# Patient Record
Sex: Male | Born: 1945 | Race: Black or African American | Hispanic: No | Marital: Single | Smoking: Never smoker
Health system: Southern US, Community
[De-identification: ages and names within clinical notes are randomized; demographics above are authoritative.]

## PROBLEM LIST (undated history)

## (undated) DIAGNOSIS — I10 Essential (primary) hypertension: Secondary | ICD-10-CM

## (undated) DIAGNOSIS — I639 Cerebral infarction, unspecified: Secondary | ICD-10-CM

## (undated) HISTORY — PX: EYE SURGERY: SHX253

---

## 2012-11-20 ENCOUNTER — Ambulatory Visit (INDEPENDENT_AMBULATORY_CARE_PROVIDER_SITE_OTHER): Payer: Medicare Other | Admitting: Family Medicine

## 2012-11-20 ENCOUNTER — Ambulatory Visit: Payer: Medicare Other

## 2012-11-20 VITALS — BP 158/100 | HR 100 | Temp 98.0°F | Resp 16 | Ht 67.0 in | Wt 217.0 lb

## 2012-11-20 DIAGNOSIS — M7050 Other bursitis of knee, unspecified knee: Secondary | ICD-10-CM

## 2012-11-20 DIAGNOSIS — M25562 Pain in left knee: Secondary | ICD-10-CM

## 2012-11-20 DIAGNOSIS — M25569 Pain in unspecified knee: Secondary | ICD-10-CM

## 2012-11-20 MED ORDER — OXAPROZIN 600 MG PO TABS: ORAL_TABLET | ORAL | Status: DC

## 2012-11-20 NOTE — Progress Notes (Signed)
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 He thinks he may have misstepped or something that causes pain to start, but for over a week he has had pain in the medial aspect of the left knee. It hurts intensely when he moves it just wrong way. He has never had a pain quite like this before.  Objective: Very tender on the medial aspect of the right knee in the area of the pelvis anserine bursa. He has no effusion in the joint. Range of motion of the joint is okay. He only has one area of focal tenderness.  Assessment: Knee pain Past anserine bursitis  Plan: Get an x-ray of his knee to make sure we don't see anything else, then decide treatment  [facility] reading (PRIMARY) by  Dr. Alwyn Ren Normal knee  Assessment: Pe anserine bursa  Plan: Patient feels like it has done much better since the x-rays, and that when she moved him around x-ray something pop and felt much better.  Offered him the options of oral medication or injections. He is brought to get steroid injections. I'll place him on Daypro, and we will see how he does. If he is not improving he can return for a cortisone shot.

## 2012-11-20 NOTE — Patient Instructions (Signed)
Pes Anserinus Syndrome with Rehab The pes anserine, also known as the goose's foot, is an area of the shinbone (tibia) near the knee joint where the tendons of three of the muscles of the thigh insert into the bone. These muscles are important for bending the knee and bringing the leg across the body. Just underneath the three tendons that attach at the pes anserinus exists a fluid filled sac (bursa) that is meant to reduce the friction between the tendons and the tibia. Pes anserinus syndrome is a condition that is characterized by inflammation of the bursa (bursitis) and/ or tendonitis (inflammation of the tendon) and may cause severe pain in the lower portion of the inner (medial) side of the knee. SYMPTOMS   Pain and inflammation over the lower portion of the medial side of the knee.  Pain that worsens as the duration of an activity increases.  Pain that worsens when bending the knee, especially against resistance.  A crackling sound (crepitation) when the tendon or bursa is moved or touched. CAUSES  Bursitis and tendonitis are usually characterized as overuse injuries. Common mechanisms of injury include:  Stress placed on the knee from a sudden increase in the intensity, frequency, or duration of training.  Direct trauma to the upper leg (less common). RISK INCREASES WITH:  Endurance sports (distance running or triathletes).  Making changes to or beginning a new training program.  Sports that place stress on the muscles that insert at the pes anserinus, such as those that require pivoting, cutting, or jumping.  Improper training.  Poor strength and flexibility  Failure to warm-up properly before activity.  Improper knee alignment ( knock knees).  Arthritis of the knee. PREVENTION  Warm up and stretch properly before activity.  Allow for adequate recovery between workouts.  Maintain physical fitness:  Strength, flexibility, and endurance.  Cardiovascular  fitness.  Learn and use training methods that will reduce the stress placed on the pes anserinus.  Arch supports (orthotics) may be helpful for those with flat feet. PROGNOSIS  If treated properly, then the symptoms of pes anserinus syndrome usually resolve within 6 weeks.  RELATED COMPLICATIONS   Persistent and potentially chronic pain if the condition is not treated properly.  Re-injury if activity is resumed before the injury is allowed to heal completely, or if one resumes improper training habits. TREATMENT Treatment initially involves the use of ice and medication to help reduce pain and inflammation. The use of strengthening and stretching exercises may help reduce pain with activity. These exercises may be performed at home or with a therapist. Individuals who have flat feet may find benefit in wearing arch supports in their shoes. Some individuals find that compression bandages or knee sleeves help reduce symptoms. Your caregiver may recommend a corticosteroid injection to help reduce inflammation. If symptoms persist, despite conservative treatment for greater than 6 months, then surgery may be recommended.  MEDICATION   If pain medication is necessary, then nonsteroidal anti-inflammatory medications, such as aspirin and ibuprofen, or other minor pain relievers, such as acetaminophen, are often recommended.  Do not take pain medication for 7 days before surgery.  Prescription pain relievers may be given if deemed necessary by your caregiver. Use only as directed and only as much as you need.  Corticosteroid injections may be given by your caregiver. These injections should be reserved for the most serious cases, because they may only be given a certain number of times. SEEK MEDICAL CARE IF:  Treatment seems to offer no  benefit, or the condition worsens.  Any medications produce adverse side effects.     Daypro twice daily for pain and inflammation Return if worse or no  better and we will give a cortisone injection

## 2013-05-14 IMAGING — CR DG KNEE COMPLETE 4+V*L*
4 series · 4 of 4 positions shown · non-contrast
Comparison: None.

CLINICAL DATA: Knee pain

LEFT KNEE - COMPLETE 4+ VIEW

[AP]
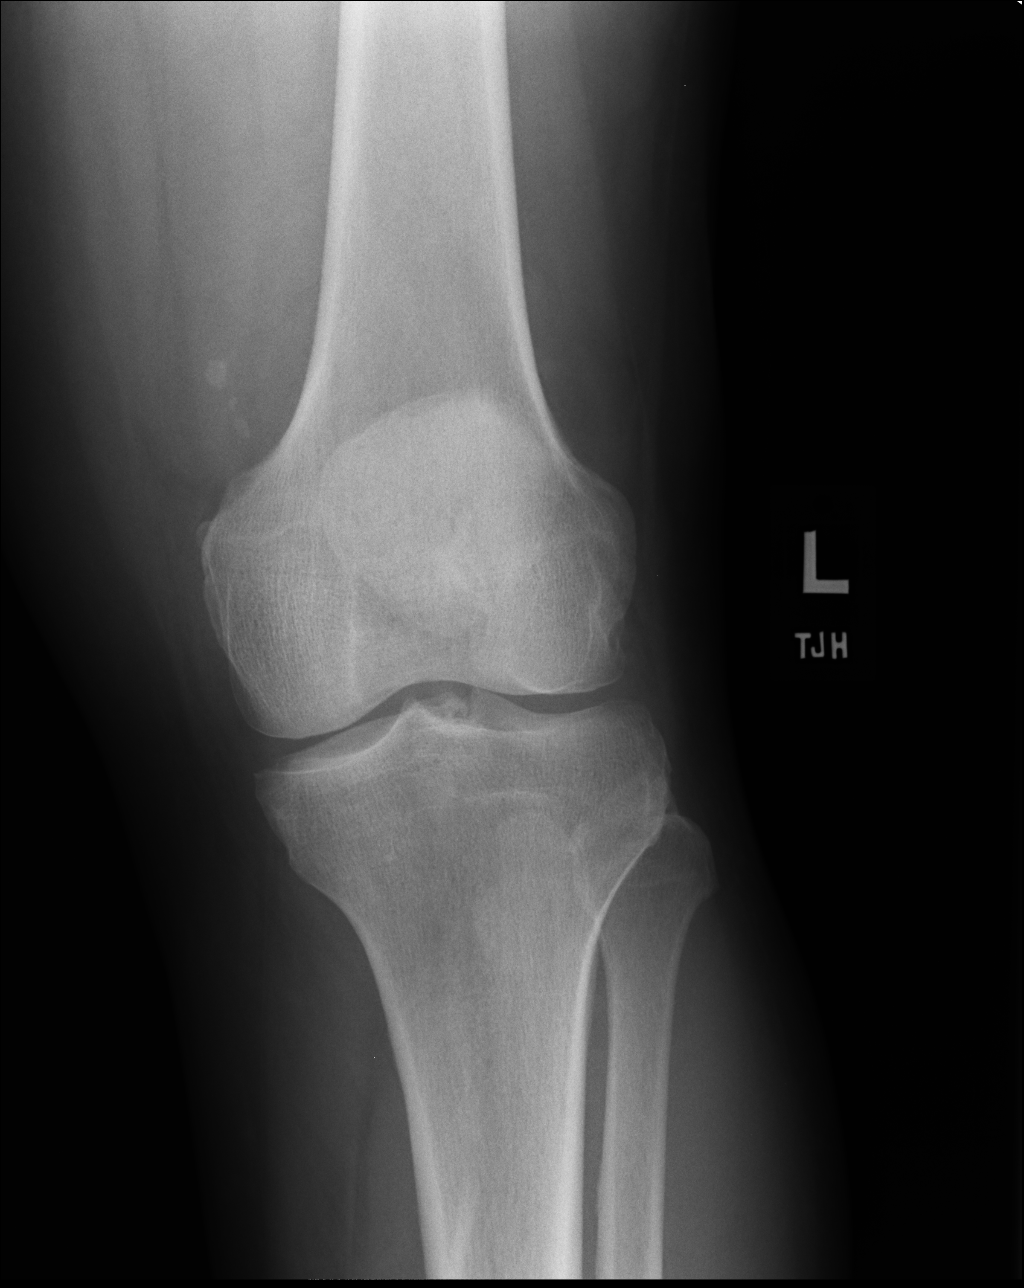

[lateral]
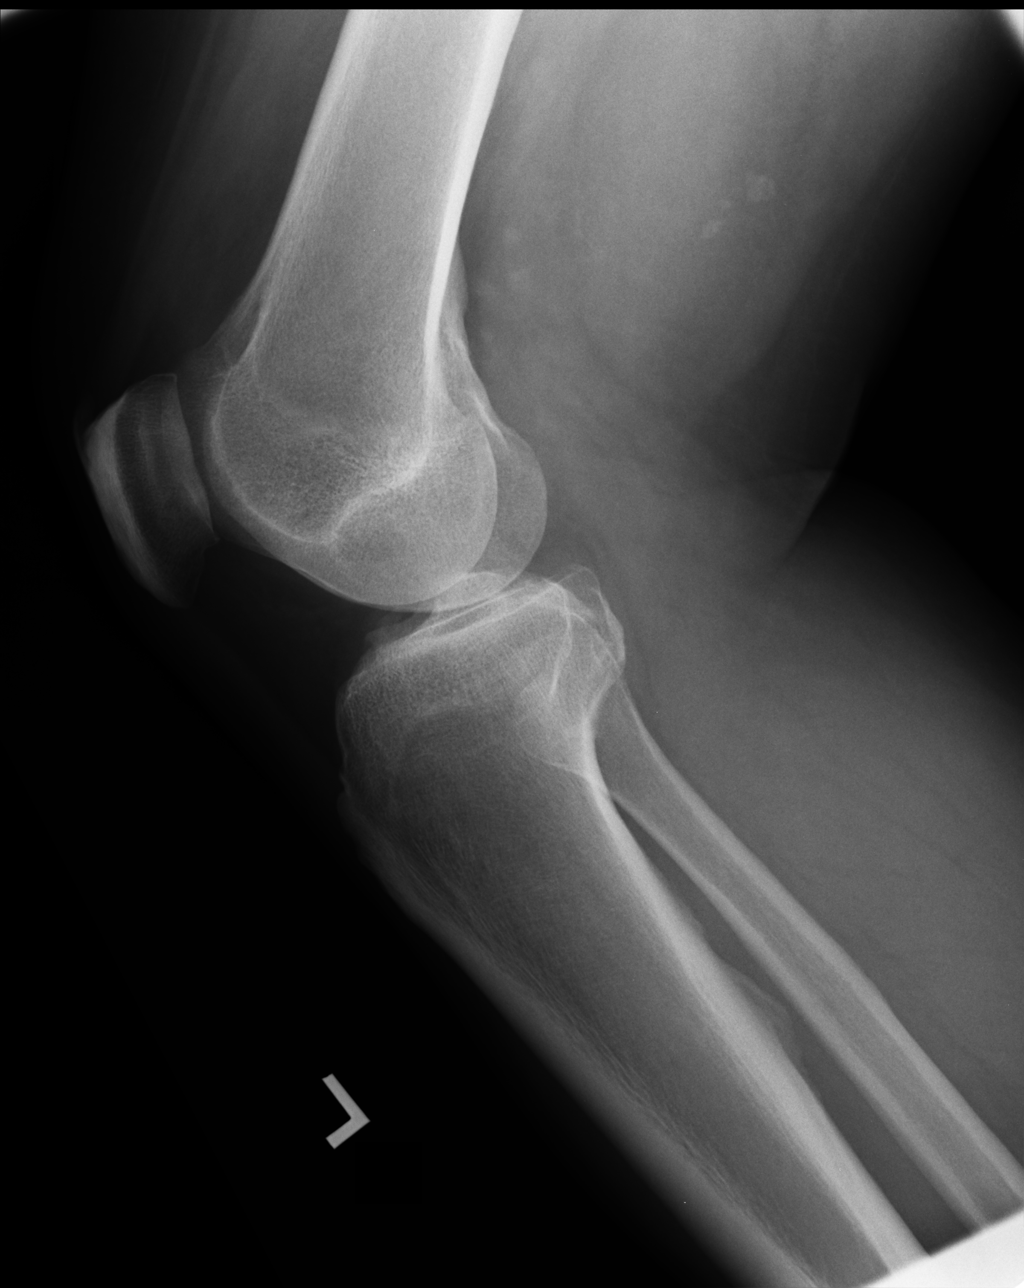

[ap ext rot]
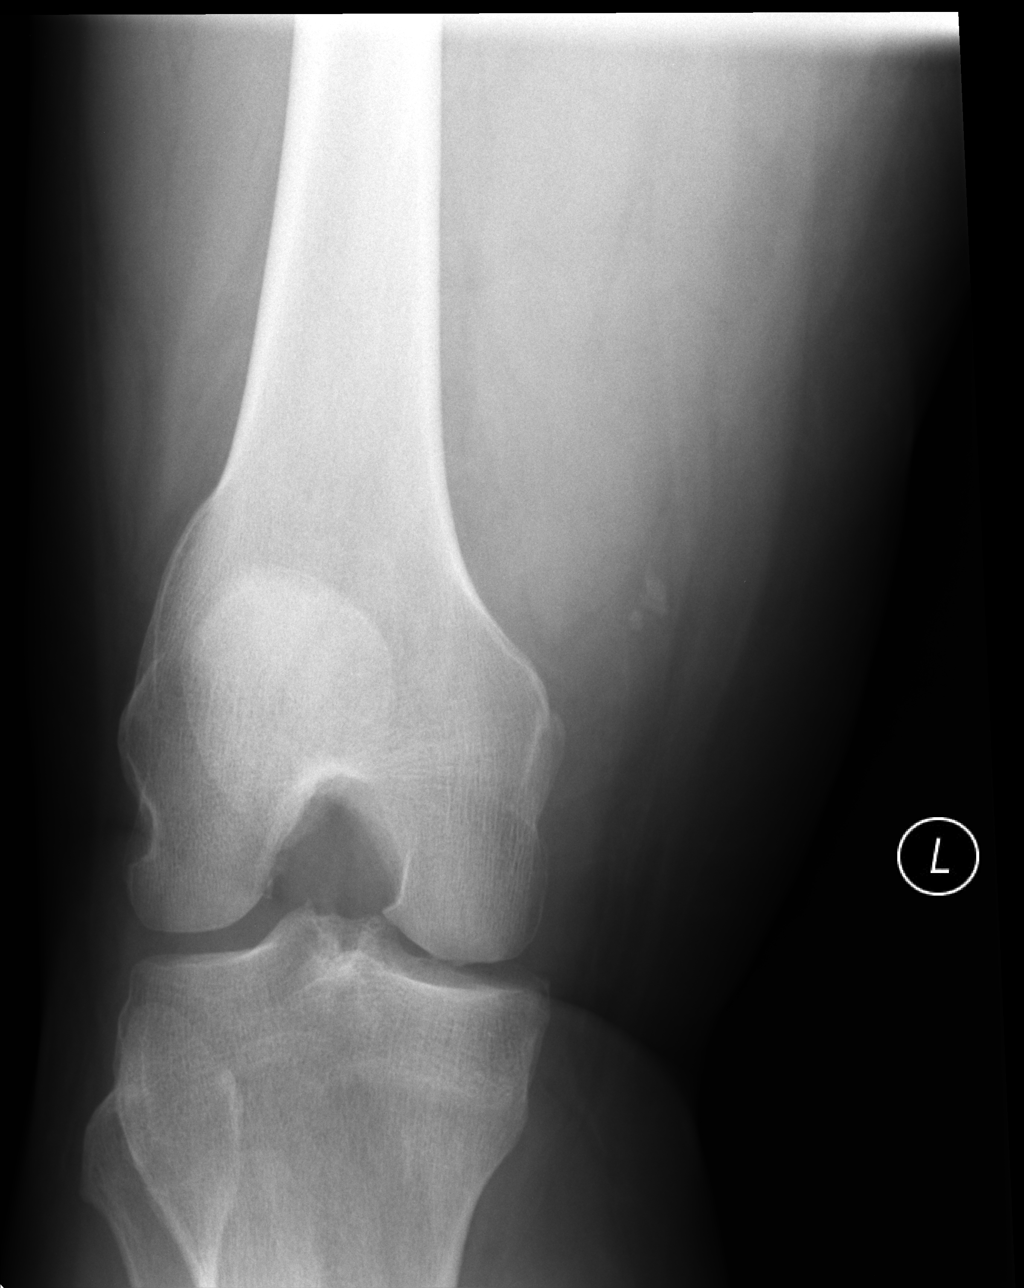

[ap int rot]
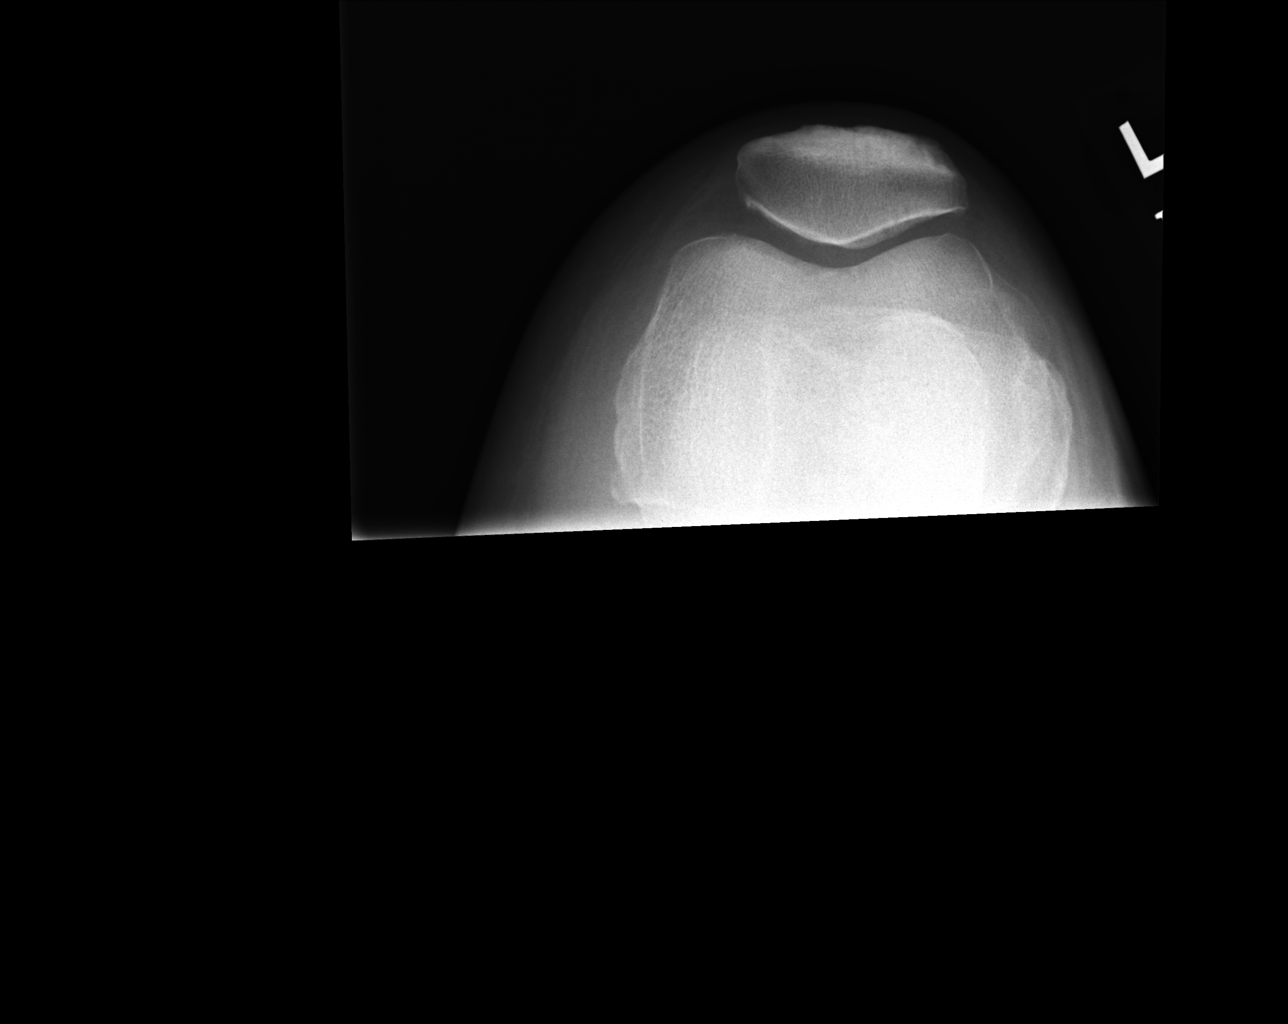

[4 of 4 positions shown; findings below may reference images not displayed]

FINDINGS: Four views of the left knee submitted.  No acute fracture
or subluxation.  No radiopaque foreign body.
IMPRESSION: No acute fracture or subluxation.

## 2019-05-22 ENCOUNTER — Emergency Department (HOSPITAL_COMMUNITY): Payer: Medicare Other

## 2019-05-22 ENCOUNTER — Encounter (HOSPITAL_COMMUNITY): Payer: Self-pay | Admitting: Emergency Medicine

## 2019-05-22 ENCOUNTER — Emergency Department (HOSPITAL_COMMUNITY)
Admission: EM | Admit: 2019-05-22 | Discharge: 2019-05-23 | Disposition: A | Discharge status: Home / Self Care | Payer: Medicare Other | Attending: Emergency Medicine | Admitting: Emergency Medicine

## 2019-05-22 DIAGNOSIS — Y999 Unspecified external cause status: Secondary | ICD-10-CM | POA: Diagnosis not present

## 2019-05-22 DIAGNOSIS — M549 Dorsalgia, unspecified: Secondary | ICD-10-CM | POA: Diagnosis not present

## 2019-05-22 DIAGNOSIS — Y939 Activity, unspecified: Secondary | ICD-10-CM | POA: Insufficient documentation

## 2019-05-22 DIAGNOSIS — Z79899 Other long term (current) drug therapy: Secondary | ICD-10-CM | POA: Diagnosis not present

## 2019-05-22 DIAGNOSIS — R030 Elevated blood-pressure reading, without diagnosis of hypertension: Secondary | ICD-10-CM | POA: Diagnosis not present

## 2019-05-22 DIAGNOSIS — S7001XA Contusion of right hip, initial encounter: Secondary | ICD-10-CM | POA: Diagnosis not present

## 2019-05-22 DIAGNOSIS — Y929 Unspecified place or not applicable: Secondary | ICD-10-CM | POA: Insufficient documentation

## 2019-05-22 DIAGNOSIS — S79911A Unspecified injury of right hip, initial encounter: Secondary | ICD-10-CM | POA: Diagnosis present

## 2019-05-22 IMAGING — DX DG HIP (WITH OR WITHOUT PELVIS) 2-3V*R*
3 series · 3 of 3 positions shown · non-contrast
Comparison: None.

CLINICAL DATA: Pain.  Right-sided hip pain.

EXAM:
DG HIP (WITH OR WITHOUT PELVIS) 2-3V RIGHT

[t pelvis ap]
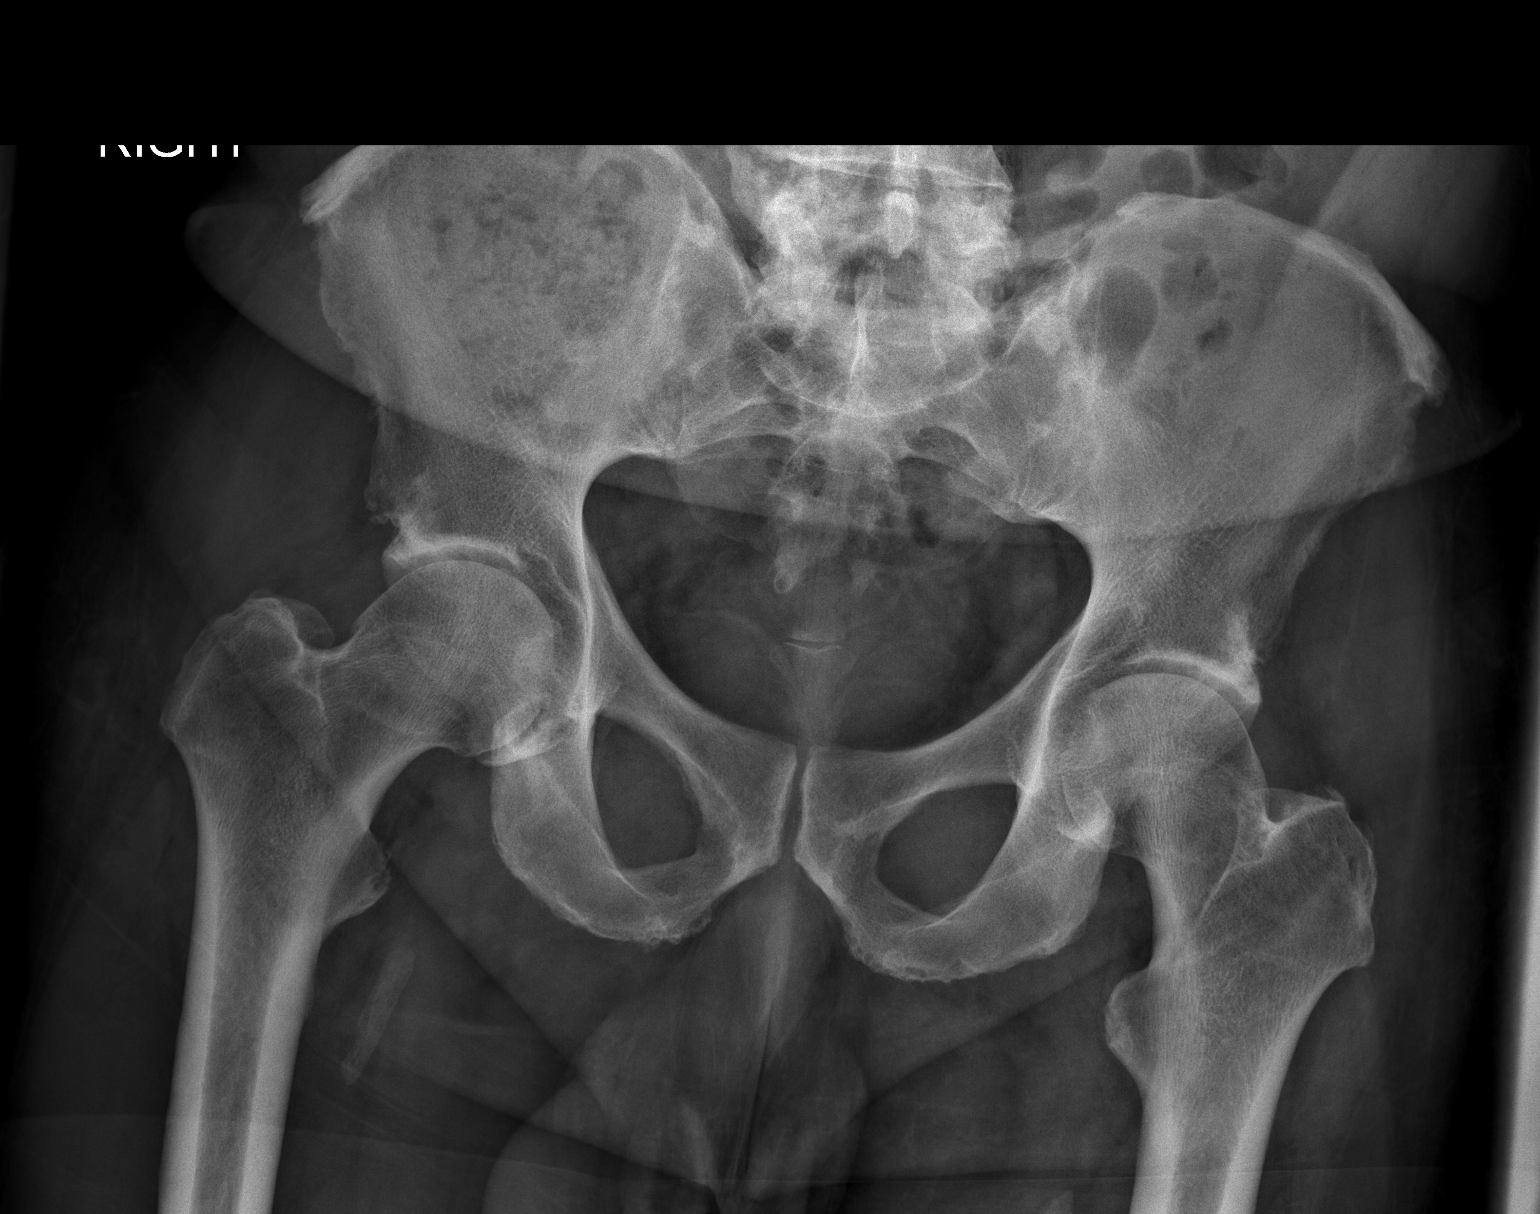

[t hip ap right]
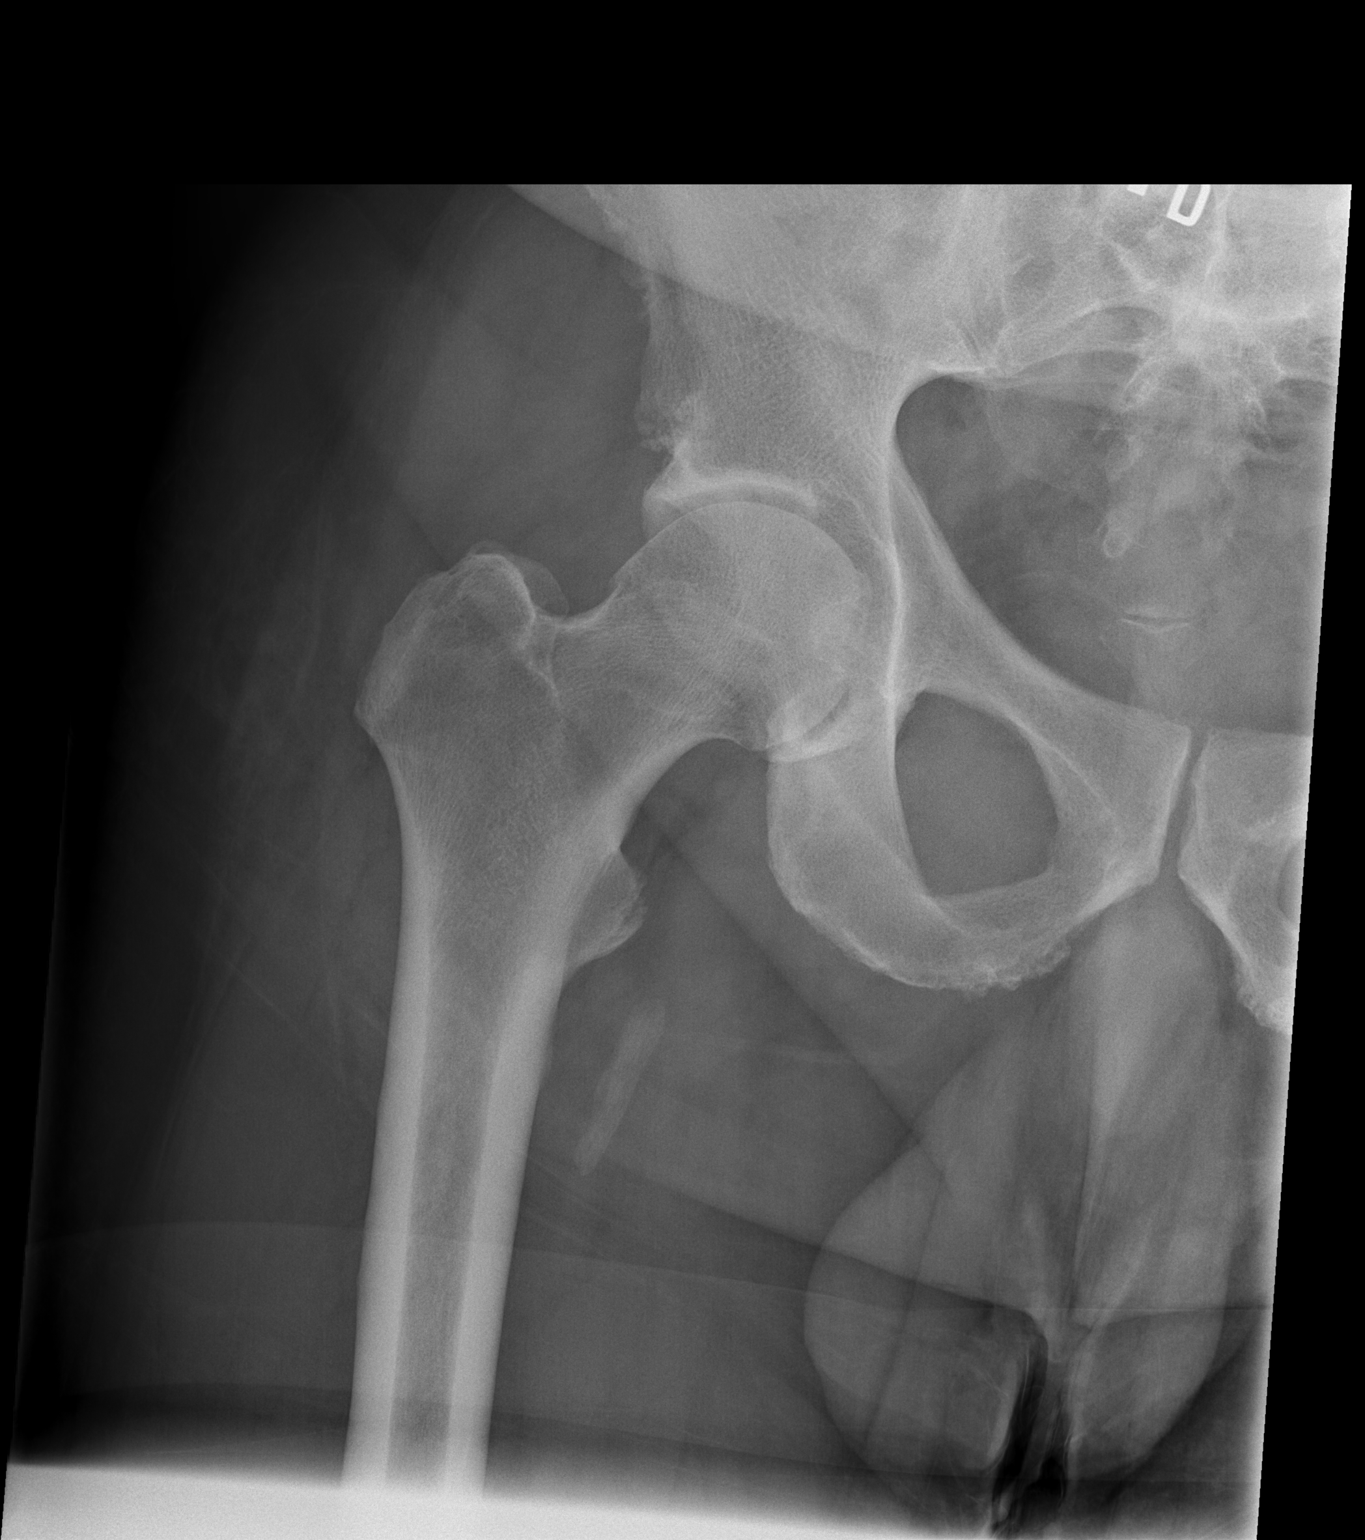

[t hip frog leg right]
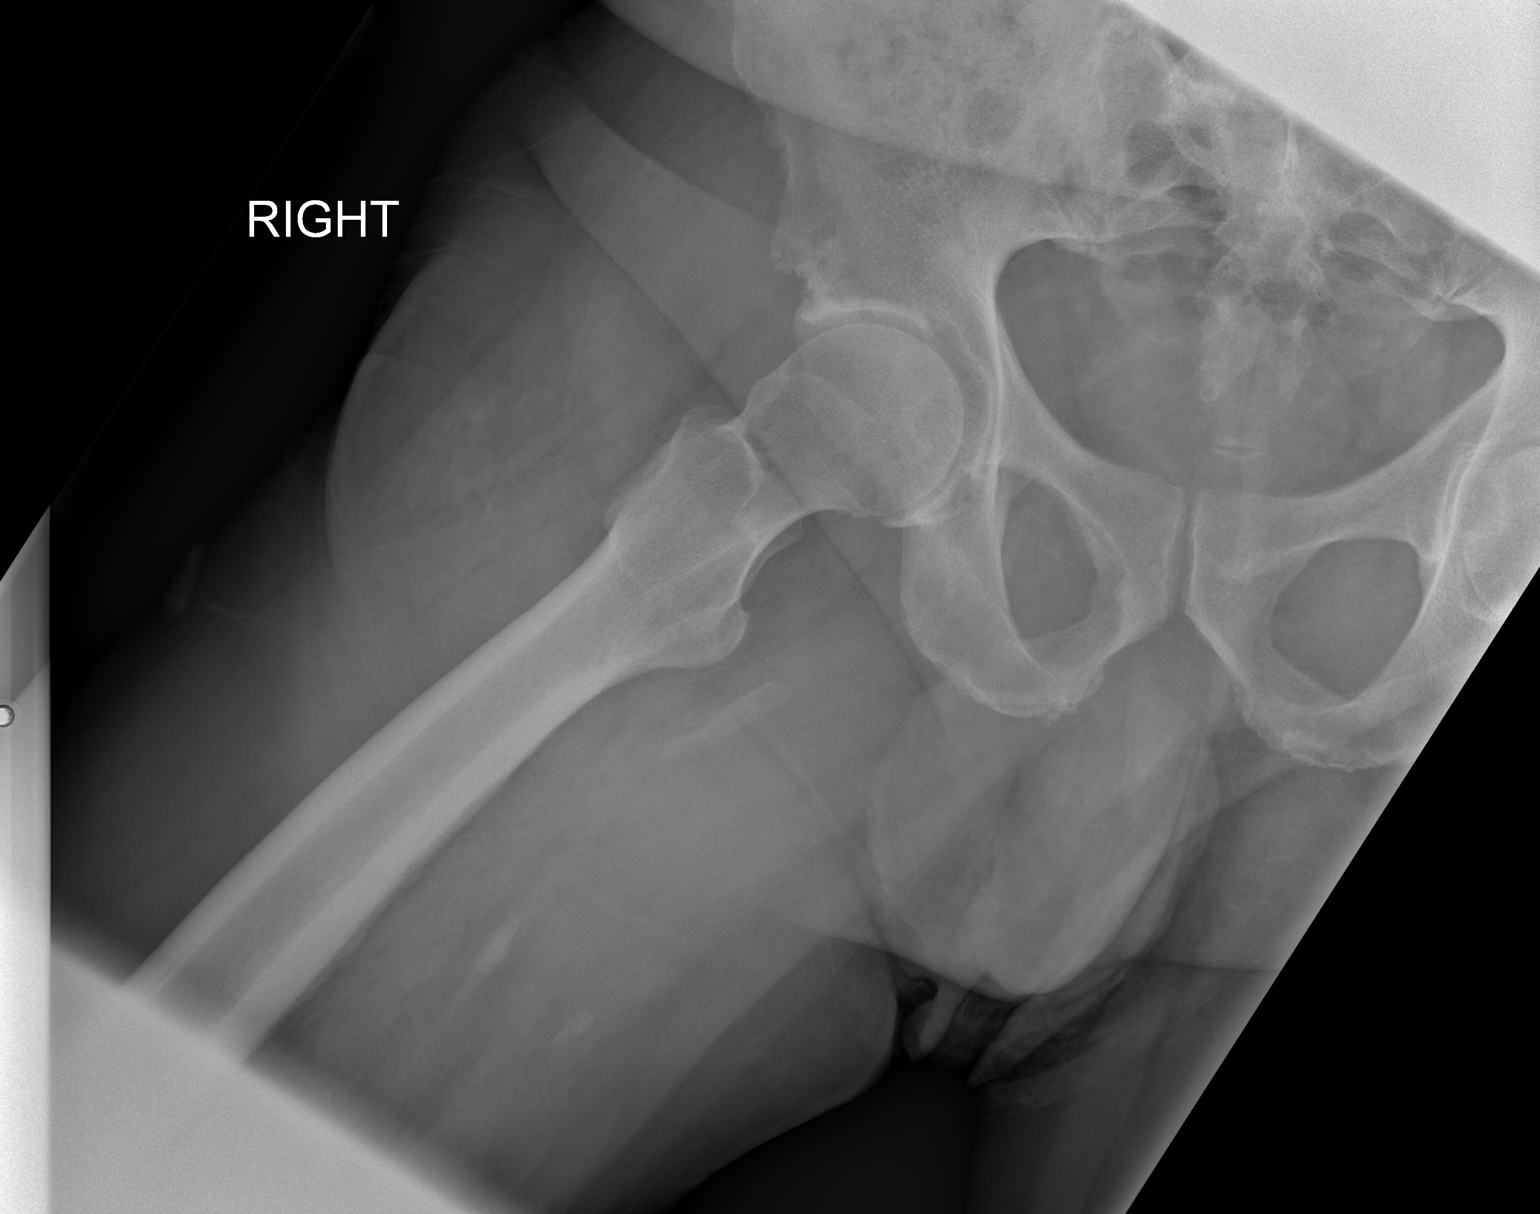

[3 of 3 positions shown; findings below may reference images not displayed]

FINDINGS: There is no acute displaced fracture or dislocation. Degenerative
changes are noted of both hips.
IMPRESSION: No acute osseous abnormality.
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
## 2019-05-23 MED ORDER — IBUPROFEN 400 MG PO TABS: 400.0000 mg | ORAL_TABLET | Freq: Three times a day (TID) | ORAL | 0 refills | Status: AC

## 2019-05-23 MED ORDER — IBUPROFEN 400 MG PO TABS
400.0000 mg | ORAL_TABLET | Freq: Once | ORAL | Status: AC
  Administered 2019-05-23: 400 mg via ORAL
  Filled 2019-05-23: qty 1

## 2019-05-23 NOTE — Discharge Instructions (Signed)
Take the medication prescribed for hip pain.  X-ray does not show any fracture.  You likely have hip contusion, ibuprofen and ice will help.  Your blood pressure is also elevated.  Unfortunately, we cannot diagnose you with high blood pressure in the ER, but it is highly recommended that you follow-up with your primary care doctor within a week for reassessment.
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
## 2021-07-25 ENCOUNTER — Other Ambulatory Visit: Payer: Self-pay

## 2021-07-25 ENCOUNTER — Inpatient Hospital Stay (HOSPITAL_COMMUNITY)
Admission: EM | Admit: 2021-07-25 | Discharge: 2021-07-28 | DRG: 065 | Disposition: A | Discharge status: Home Health | Payer: Medicare Other | Attending: Internal Medicine | Admitting: Internal Medicine

## 2021-07-25 ENCOUNTER — Inpatient Hospital Stay (HOSPITAL_COMMUNITY): Payer: Medicare Other

## 2021-07-25 ENCOUNTER — Emergency Department (HOSPITAL_COMMUNITY): Payer: Medicare Other

## 2021-07-25 ENCOUNTER — Encounter (HOSPITAL_COMMUNITY): Payer: Self-pay

## 2021-07-25 DIAGNOSIS — I10 Essential (primary) hypertension: Secondary | ICD-10-CM

## 2021-07-25 DIAGNOSIS — Z79899 Other long term (current) drug therapy: Secondary | ICD-10-CM

## 2021-07-25 DIAGNOSIS — H5501 Congenital nystagmus: Secondary | ICD-10-CM | POA: Diagnosis present

## 2021-07-25 DIAGNOSIS — Z6830 Body mass index (BMI) 30.0-30.9, adult: Secondary | ICD-10-CM | POA: Diagnosis not present

## 2021-07-25 DIAGNOSIS — E785 Hyperlipidemia, unspecified: Secondary | ICD-10-CM

## 2021-07-25 DIAGNOSIS — R29702 NIHSS score 2: Secondary | ICD-10-CM | POA: Diagnosis present

## 2021-07-25 DIAGNOSIS — I639 Cerebral infarction, unspecified: Secondary | ICD-10-CM | POA: Diagnosis not present

## 2021-07-25 DIAGNOSIS — I6389 Other cerebral infarction: Secondary | ICD-10-CM | POA: Diagnosis not present

## 2021-07-25 DIAGNOSIS — Z20822 Contact with and (suspected) exposure to covid-19: Secondary | ICD-10-CM | POA: Diagnosis present

## 2021-07-25 DIAGNOSIS — I63211 Cerebral infarction due to unspecified occlusion or stenosis of right vertebral arteries: Principal | ICD-10-CM | POA: Diagnosis present

## 2021-07-25 DIAGNOSIS — R42 Dizziness and giddiness: Secondary | ICD-10-CM

## 2021-07-25 DIAGNOSIS — R531 Weakness: Secondary | ICD-10-CM | POA: Diagnosis not present

## 2021-07-25 DIAGNOSIS — G4733 Obstructive sleep apnea (adult) (pediatric): Secondary | ICD-10-CM | POA: Diagnosis present

## 2021-07-25 DIAGNOSIS — E669 Obesity, unspecified: Secondary | ICD-10-CM | POA: Diagnosis present

## 2021-07-25 DIAGNOSIS — G8194 Hemiplegia, unspecified affecting left nondominant side: Secondary | ICD-10-CM | POA: Diagnosis present

## 2021-07-25 HISTORY — DX: Essential (primary) hypertension: I10

## 2021-07-25 LAB — RESP PANEL BY RT-PCR (FLU A&B, COVID) ARPGX2
Influenza A by PCR: NEGATIVE
Influenza B by PCR: NEGATIVE
SARS Coronavirus 2 by RT PCR: NEGATIVE

## 2021-07-25 LAB — COMPREHENSIVE METABOLIC PANEL
ALT: 21 U/L (ref 0–44)
AST: 17 U/L (ref 15–41)
Albumin: 3.6 g/dL (ref 3.5–5.0)
Alkaline Phosphatase: 78 U/L (ref 38–126)
Anion gap: 4 — ABNORMAL LOW (ref 5–15)
BUN: 14 mg/dL (ref 8–23)
CO2: 24 mmol/L (ref 22–32)
Calcium: 8.5 mg/dL — ABNORMAL LOW (ref 8.9–10.3)
Chloride: 104 mmol/L (ref 98–111)
Creatinine, Ser: 1 mg/dL (ref 0.61–1.24)
GFR, Estimated: >60 mL/min (ref >60)
Glucose, Bld: 97 mg/dL (ref 70–99)
Potassium: 3.6 mmol/L (ref 3.5–5.1)
Sodium: 132 mmol/L — ABNORMAL LOW (ref 135–145)
Total Bilirubin: 0.7 mg/dL (ref 0.3–1.2)
Total Protein: 8.5 g/dL — ABNORMAL HIGH (ref 6.5–8.1)

## 2021-07-25 LAB — DIFFERENTIAL
Abs Immature Granulocytes: 0.01 10*3/uL (ref 0.00–0.07)
Basophils Absolute: 0 10*3/uL (ref 0.0–0.1)
Basophils Relative: 1 %
Eosinophils Absolute: 0 10*3/uL (ref 0.0–0.5)
Eosinophils Relative: 1 %
Immature Granulocytes: 0 %
Lymphocytes Relative: 32 %
Lymphs Abs: 1.9 10*3/uL (ref 0.7–4.0)
Monocytes Absolute: 0.6 10*3/uL (ref 0.1–1.0)
Monocytes Relative: 9 %
Neutro Abs: 3.5 10*3/uL (ref 1.7–7.7)
Neutrophils Relative %: 57 %

## 2021-07-25 LAB — I-STAT CHEM 8, ED
BUN: 15 mg/dL (ref 8–23)
Calcium, Ion: 1.13 mmol/L — ABNORMAL LOW (ref 1.15–1.40)
Chloride: 102 mmol/L (ref 98–111)
Creatinine, Ser: 0.9 mg/dL (ref 0.61–1.24)
Glucose, Bld: 97 mg/dL (ref 70–99)
HCT: 46 % (ref 39.0–52.0)
Hemoglobin: 15.6 g/dL (ref 13.0–17.0)
Potassium: 3.7 mmol/L (ref 3.5–5.1)
Sodium: 139 mmol/L (ref 135–145)
TCO2: 24 mmol/L (ref 22–32)

## 2021-07-25 LAB — CBC
HCT: 43.3 % (ref 39.0–52.0)
Hemoglobin: 13.9 g/dL (ref 13.0–17.0)
MCH: 29.6 pg (ref 26.0–34.0)
MCHC: 32.1 g/dL (ref 30.0–36.0)
MCV: 92.3 fL (ref 80.0–100.0)
Platelets: 248 10*3/uL (ref 150–400)
RBC: 4.69 MIL/uL (ref 4.22–5.81)
RDW: 14.7 % (ref 11.5–15.5)
WBC: 6.1 10*3/uL (ref 4.0–10.5)
nRBC: 0 % (ref 0.0–0.2)

## 2021-07-25 LAB — PROTIME-INR
INR: 1 (ref 0.8–1.2)
Prothrombin Time: 12.9 seconds (ref 11.4–15.2)

## 2021-07-25 LAB — APTT: aPTT: 28 seconds (ref 24–36)

## 2021-07-25 IMAGING — MR MR MRA NECK WO/W CM
4 of 7 series · 16 of 48 positions shown · IV contrast (Yes GAD)
Comparison: None
COMPARISON: None

Addendum:
CLINICAL DATA: Stroke, follow up; neuro deficit, acute, stroke
suspected

EXAM:
MRI HEAD WITHOUT CONTRAST
MRA HEAD WITHOUT CONTRAST
MRA NECK WITHOUT AND WITH CONTRAST
TECHNIQUE: Multiplanar, multi-echo pulse sequences of the brain and surrounding
structures were acquired without intravenous contrast. Angiographic
images of the Circle of Willis were acquired using MRA technique
without intravenous contrast. Angiographic images of the neck were
acquired using MRA technique without and with intravenous contrast.
Carotid stenosis measurements (when applicable) are obtained
utilizing NASCET criteria, using the distal internal carotid
diameter as the denominator.

[Series 300: cor cemra ft · coronal · 1.2mm · 0.59mm/px · 7 of 121 slices shown]
[im 1/121]
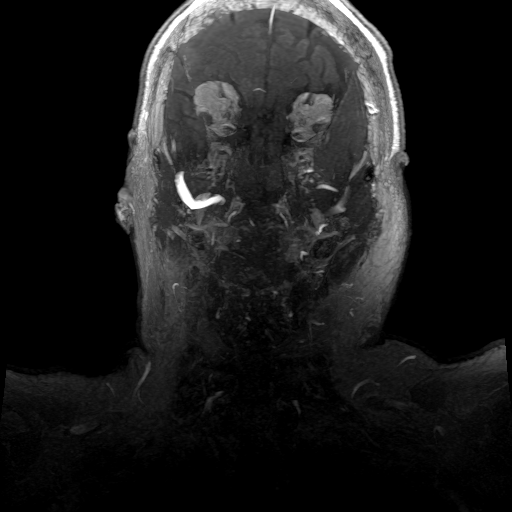
[im 21/121]
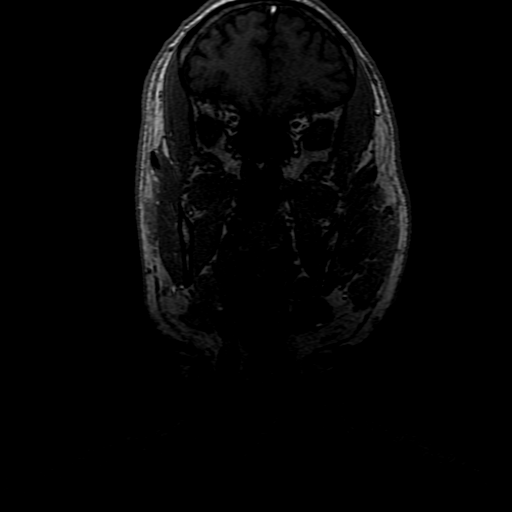
[im 41/121]
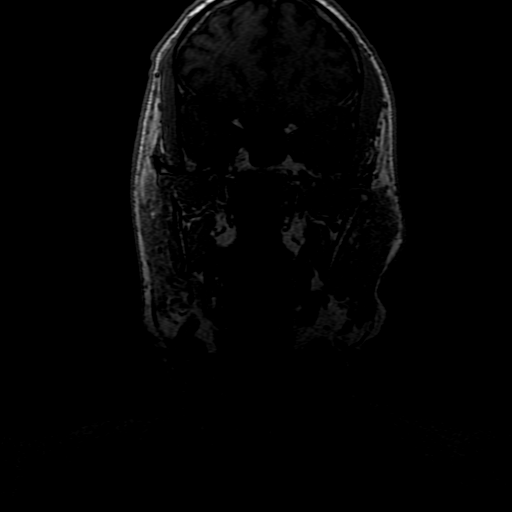
[im 61/121]
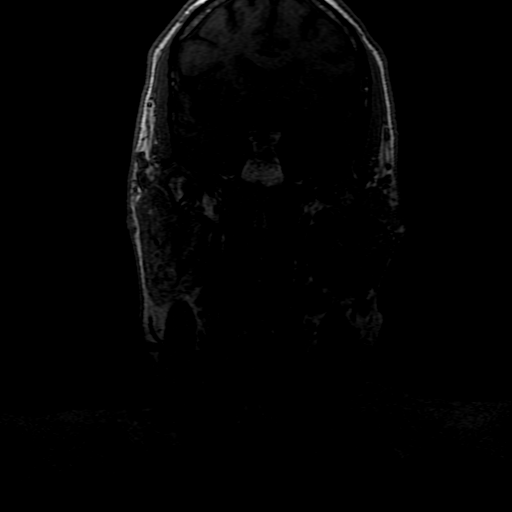
[im 81/121]
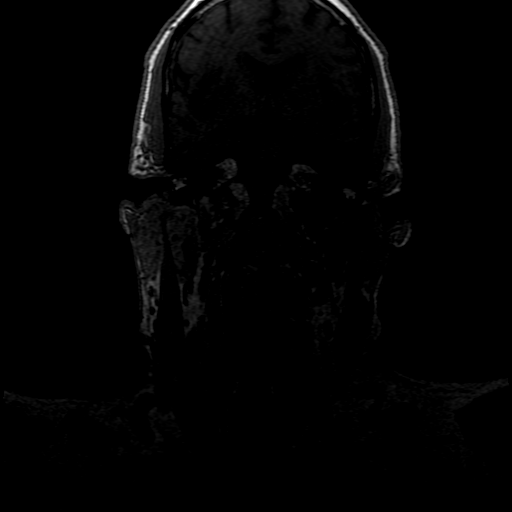
[im 101/121]
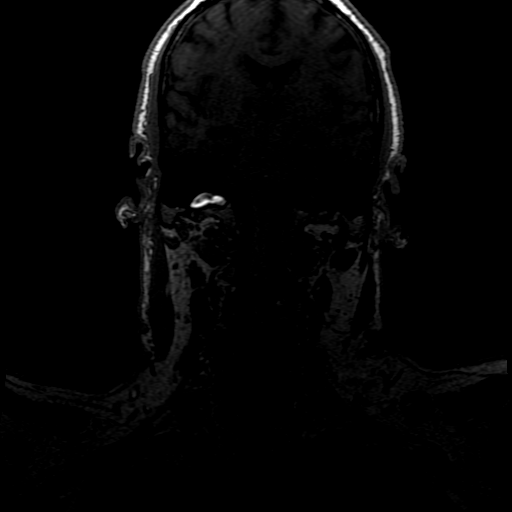
[im 121/121]
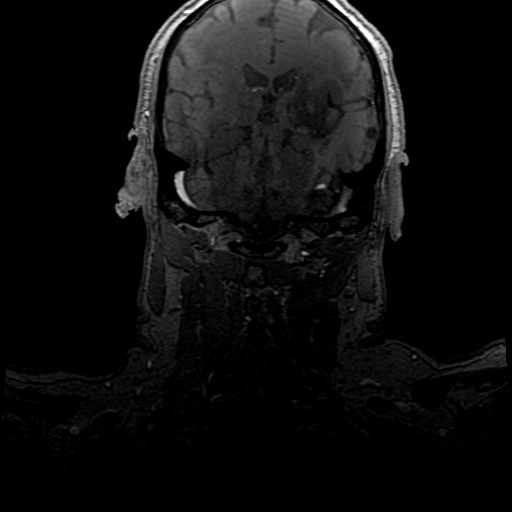

[Series 301: ph1/cor cemra ft · coronal · 1.2mm · 0.59mm/px · 3 of 120 slices shown]
[im 20/120]
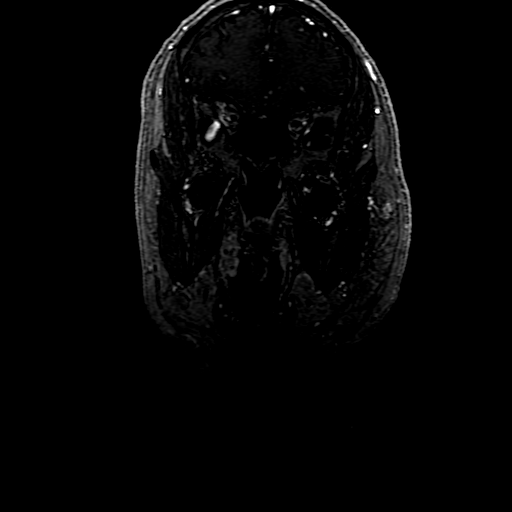
[im 60/120]
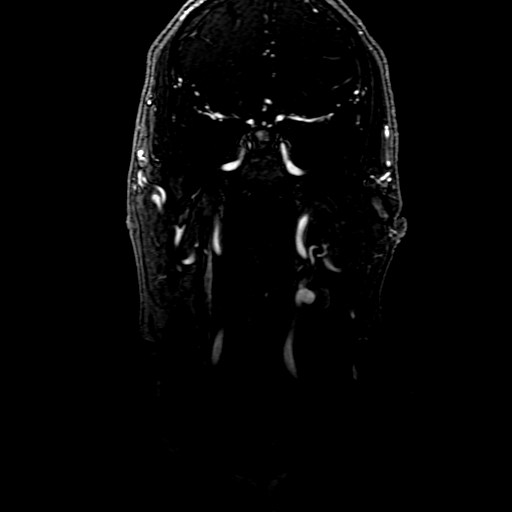
[im 100/120]
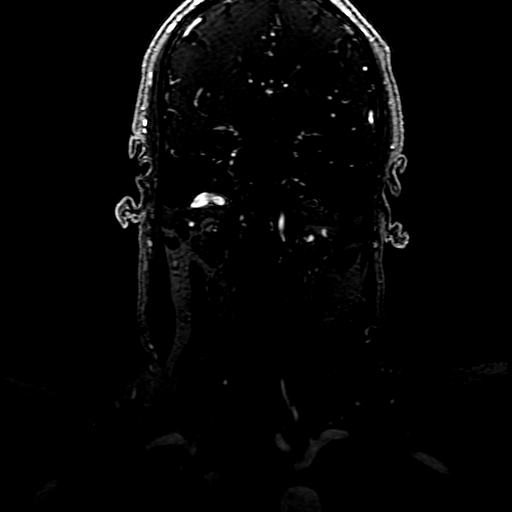

[Series 302: ph2/cor cemra ft · coronal · 1.2mm · 0.59mm/px · 3 of 120 slices shown]
[im 20/120]
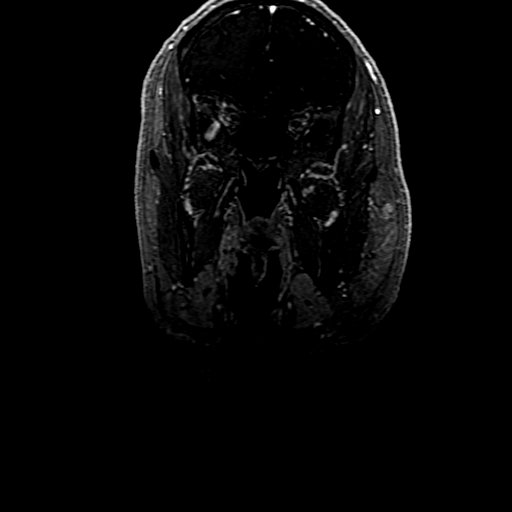
[im 60/120]
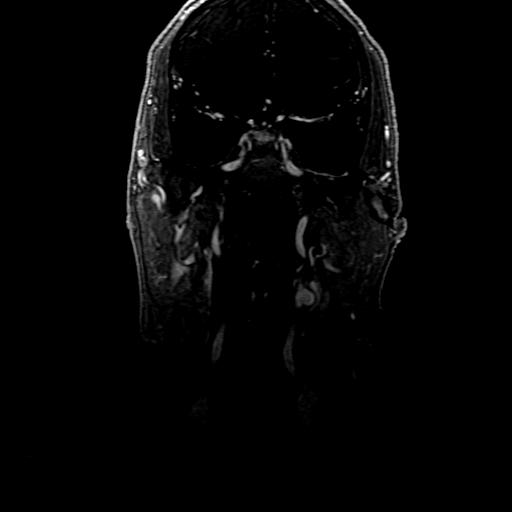
[im 100/120]
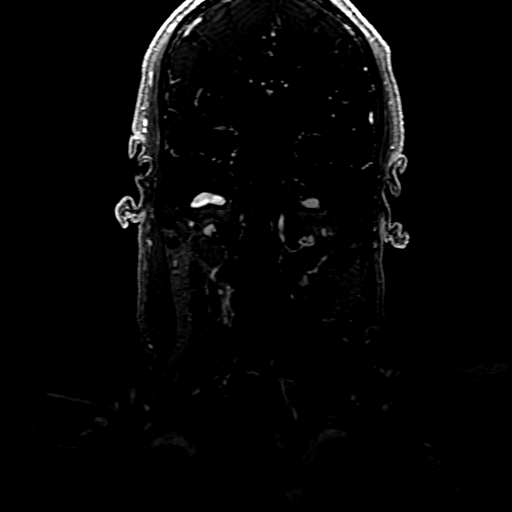

[((date))-((date)) · coronal · 1.2mm · 0.59mm/px · 3 of 121 slices shown]
[im 21/121]
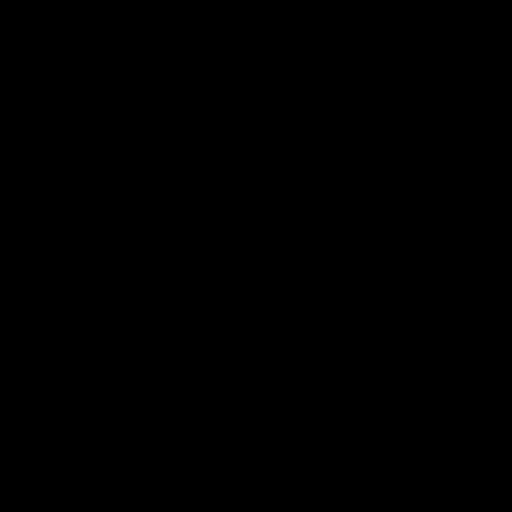
[im 61/121]
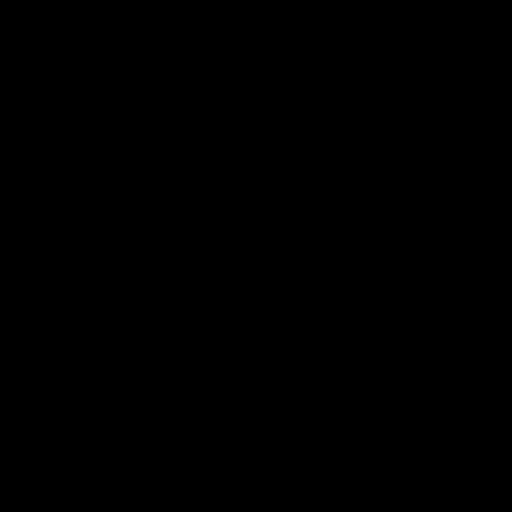
[im 101/121]
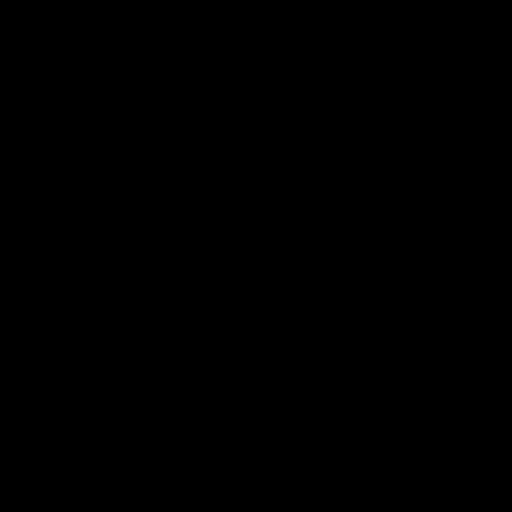

[16 of 48 positions shown; findings below may reference images not displayed]

FINDINGS: MRI HEAD

Brain: Small focus of reduced diffusion is present along the right
ventral medulla (series 2, image 10). There is no evidence of
intracranial hemorrhage. Prominent left cerebellar developmental
venous anomaly. Additional right frontal developmental venous
anomaly with some associated T2 hyperintensity that may reflect
gliosis less likely edema from venous congestion. Patchy and
confluent areas of T2 hyperintensity in the supratentorial white
matter are nonspecific but may reflect mild to moderate chronic
microvascular ischemic changes. Scattered prominent perivascular
spaces and/or chronic small vessel infarcts.

There is no intracranial mass, mass effect, or edema. There is no
hydrocephalus or extra-axial fluid collection.

Vascular: Major vessel flow voids at the skull base are preserved.

Skull and upper cervical spine: Normal marrow signal is preserved.

Sinuses/Orbits: Paranasal sinuses are aerated. Orbits are
unremarkable.

Other: Sella is unremarkable.  Mastoid air cells are clear.

MRA HEAD

Intracranial internal carotid arteries are patent with
atherosclerotic irregularity. Middle and anterior cerebral arteries
are patent. There is diminished flow related enhancement of the
intracranial right vertebral artery. On the post-contrast neck
imaging, there is irregularity of the intracranial vertebral artery
with high-grade stenosis distally. Intracranial left vertebral
arteries, basilar artery, posterior cerebral arteries are patent.
Bilateral posterior communicating arteries are present. There is no
significant stenosis or aneurysm.

MRA NECK

Common, internal, and external carotid arteries are patent. No
hemodynamically significant stenosis. Retropharyngeal course of the
right ICA.

Dominant left vertebral artery is patent. Right vertebral artery is
patent.
IMPRESSION: Suspected small acute infarct of the right ventral medulla.

Mild to moderate chronic microvascular ischemic changes.

Irregularity of the intracranial right vertebral artery with
high-grade stenosis distally.

ADDENDUM:
Contrast dose is 10 mL Gadavist

*** End of Addendum ***
FINDINGS: MRI HEAD

Brain: Small focus of reduced diffusion is present along the right
ventral medulla (series 2, image 10). There is no evidence of
intracranial hemorrhage. Prominent left cerebellar developmental
venous anomaly. Additional right frontal developmental venous
anomaly with some associated T2 hyperintensity that may reflect
gliosis less likely edema from venous congestion. Patchy and
confluent areas of T2 hyperintensity in the supratentorial white
matter are nonspecific but may reflect mild to moderate chronic
microvascular ischemic changes. Scattered prominent perivascular
spaces and/or chronic small vessel infarcts.

There is no intracranial mass, mass effect, or edema. There is no
hydrocephalus or extra-axial fluid collection.

Vascular: Major vessel flow voids at the skull base are preserved.

Skull and upper cervical spine: Normal marrow signal is preserved.

Sinuses/Orbits: Paranasal sinuses are aerated. Orbits are
unremarkable.

Other: Sella is unremarkable.  Mastoid air cells are clear.

MRA HEAD

Intracranial internal carotid arteries are patent with
atherosclerotic irregularity. Middle and anterior cerebral arteries
are patent. There is diminished flow related enhancement of the
intracranial right vertebral artery. On the post-contrast neck
imaging, there is irregularity of the intracranial vertebral artery
with high-grade stenosis distally. Intracranial left vertebral
arteries, basilar artery, posterior cerebral arteries are patent.
Bilateral posterior communicating arteries are present. There is no
significant stenosis or aneurysm.

MRA NECK

Common, internal, and external carotid arteries are patent. No
hemodynamically significant stenosis. Retropharyngeal course of the
right ICA.

Dominant left vertebral artery is patent. Right vertebral artery is
patent.
IMPRESSION: Suspected small acute infarct of the right ventral medulla.

Mild to moderate chronic microvascular ischemic changes.

Irregularity of the intracranial right vertebral artery with
high-grade stenosis distally.

## 2021-07-25 IMAGING — CT CT HEAD W/O CM
3 series · 15 of 37 positions shown, 17 images · non-contrast
Comparison: None.

CLINICAL DATA: Dizziness and left-sided weakness since 5 a.m.
yesterday



[Series 3: head without · axial · non-contrast · 0.42mm/px · z∈[-158,-38]mm · 7 of 33 slices shown, 9 images]
[im 5/33  brain]
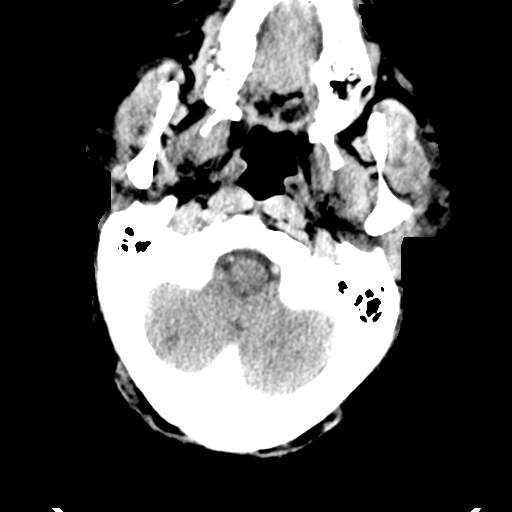
[im 5/33  bone]
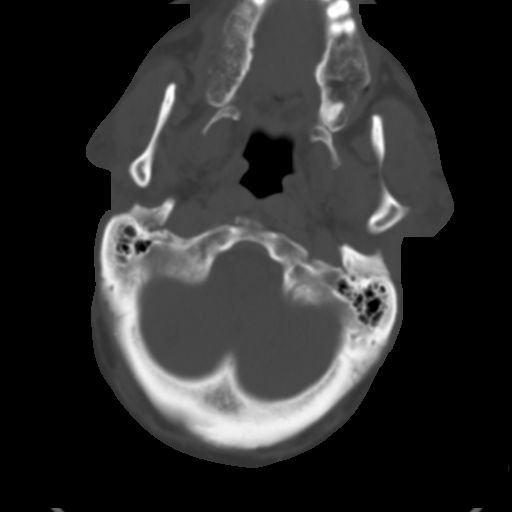
[im 9/33  brain]
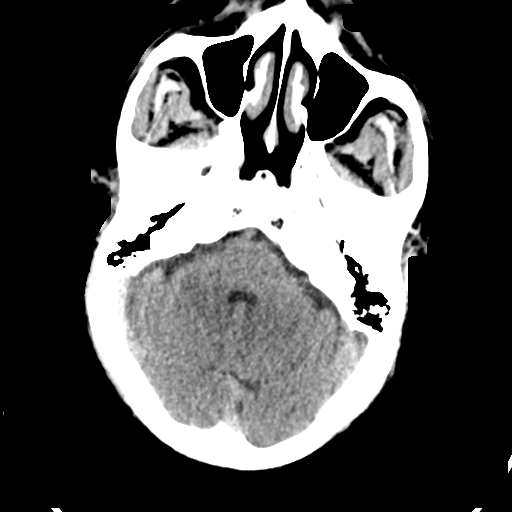
[im 13/33  brain]
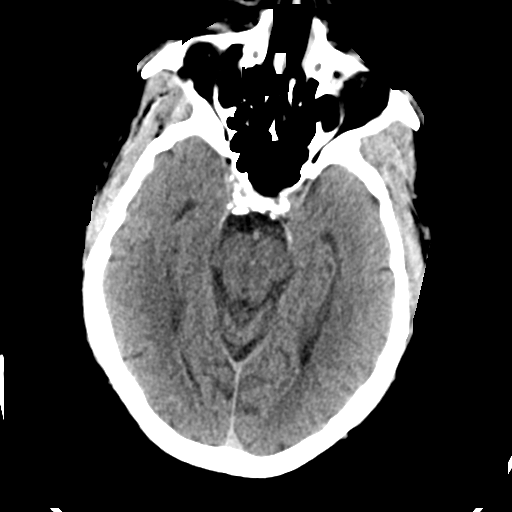
[im 17/33  brain]
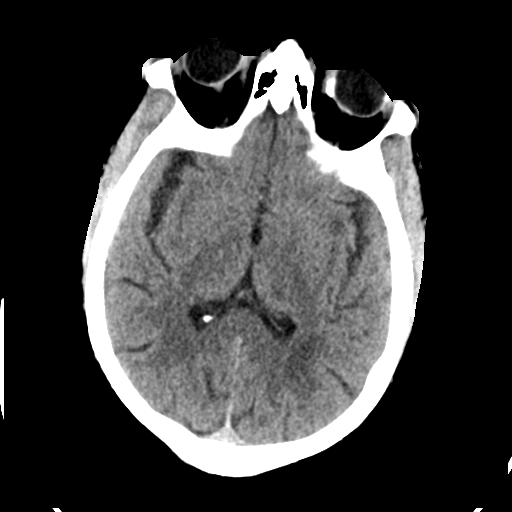
[im 21/33  brain]
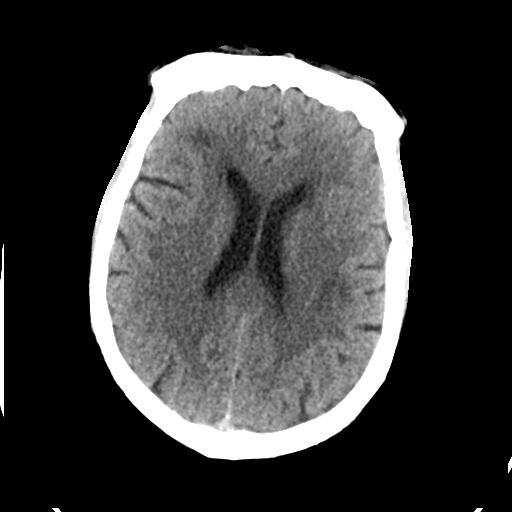
[im 21/33  bone]
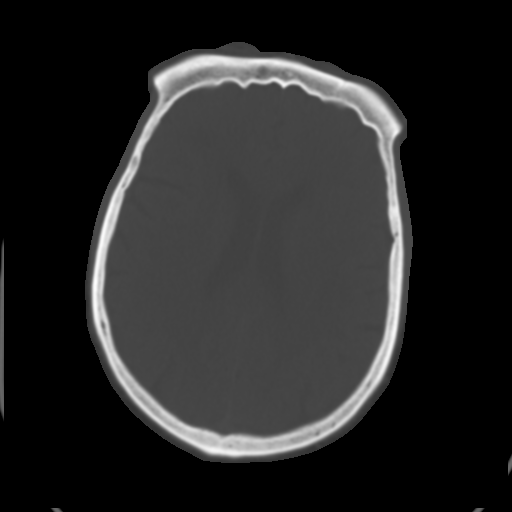
[im 25/33  brain]
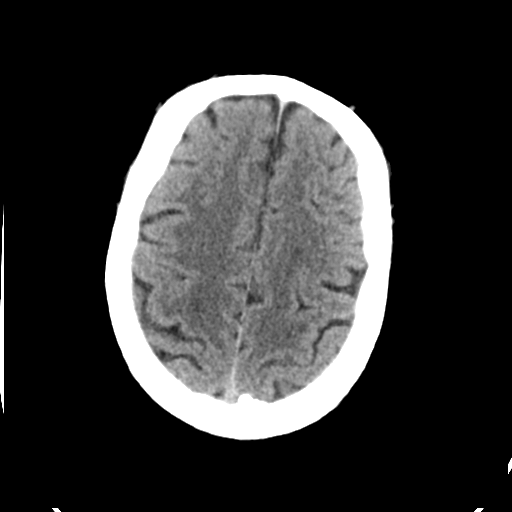
[im 29/33  brain]
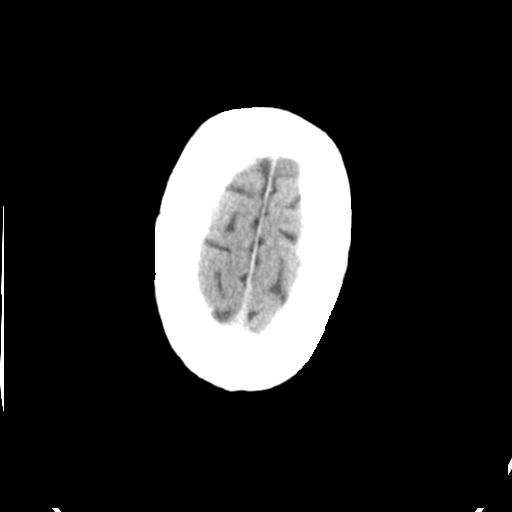

[Series 4: head bone · axial · 0.42mm/px · z∈[-162,-90]mm · 5 of 82 slices shown]
[im 9/82  bone]
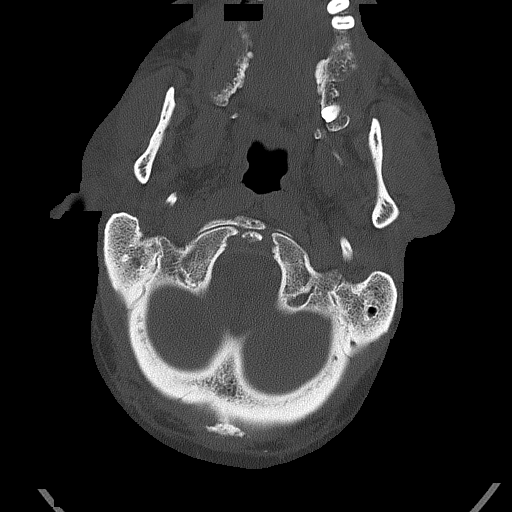
[im 17/82  bone]
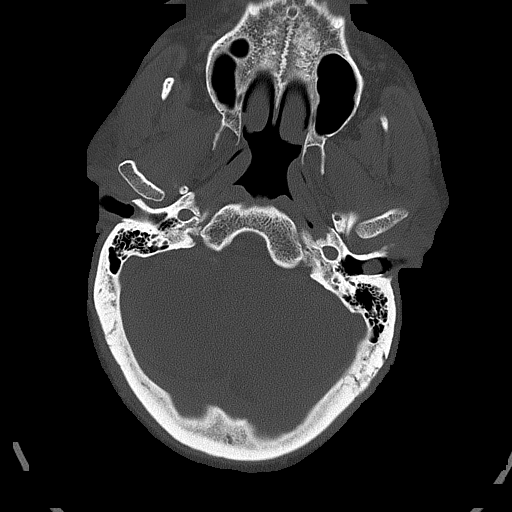
[im 25/82  bone]
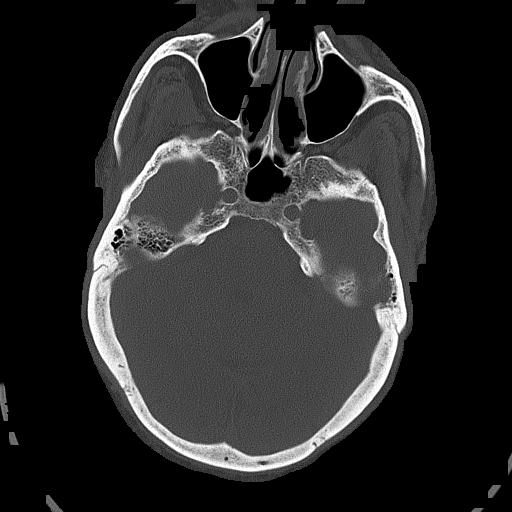
[im 37/82  bone]
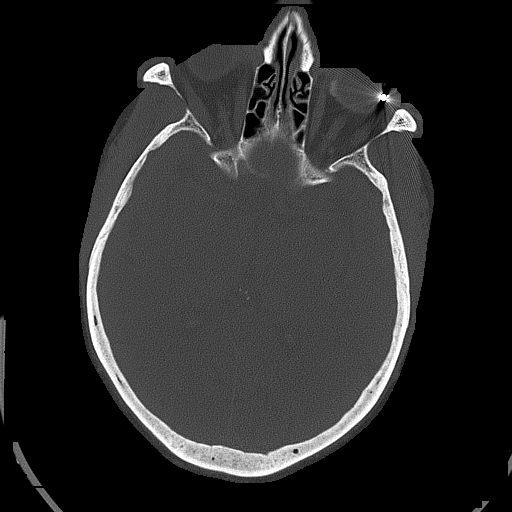
[im 45/82  bone]
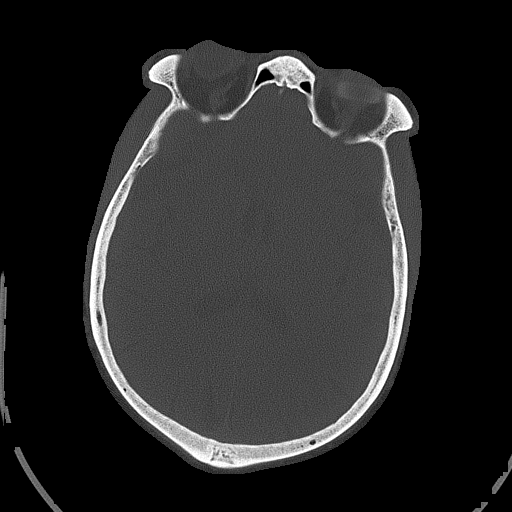

[Series 6: head without sag · sagittal · non-contrast · 0.28mm/px · 3 of 57 slices shown]
[im 19/57  brain]
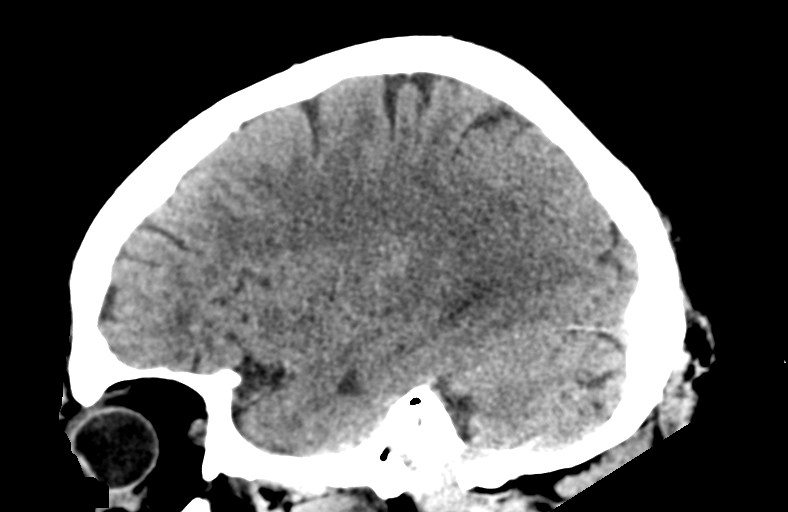
[im 29/57  brain]
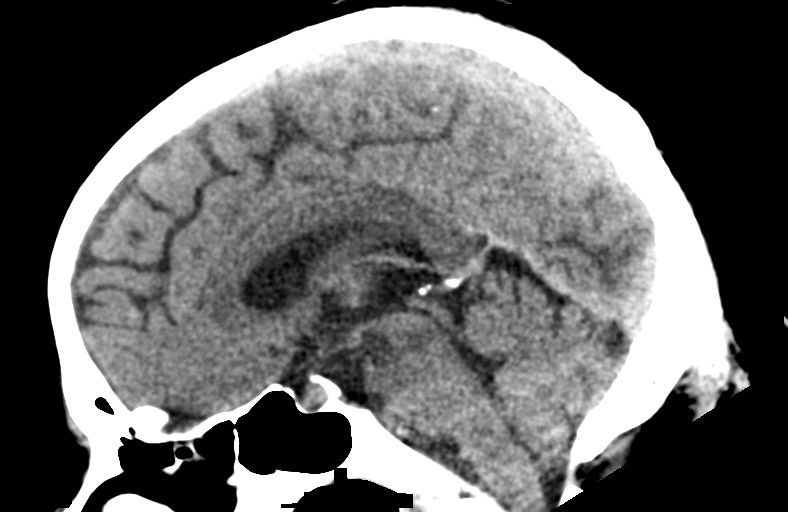
[im 38/57  brain]
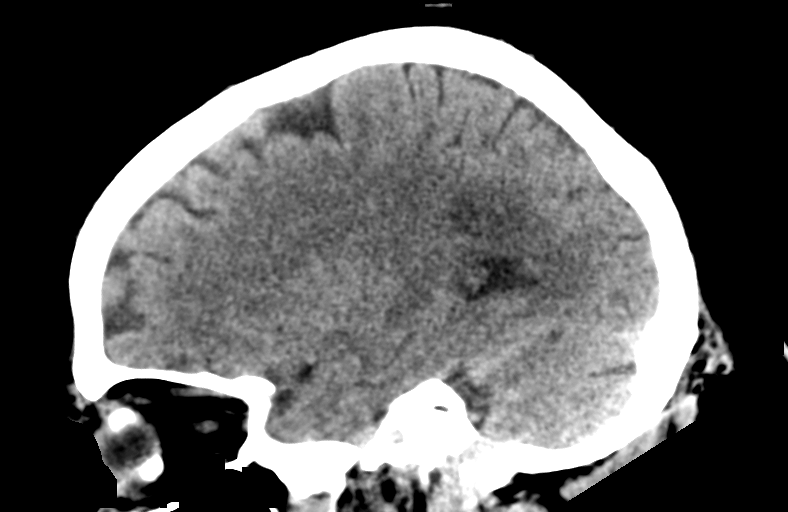

[15 of 37 positions shown; findings below may reference images not displayed]

FINDINGS: Brain: Subcortical white matter hypodensities are seen within the
bilateral frontal regions and left parietal region, consistent with
age-indeterminate small vessel ischemic change. Focal hypodensity
inferior right cerebellar hemisphere consistent with age
indeterminate cerebellar infarct. No other signs of acute infarct or
hemorrhage. Lateral ventricles and midline structures are
unremarkable. No acute extra-axial fluid collections. No mass
effect.

Vascular: No hyperdense vessel or unexpected calcification.

Skull: Normal. Negative for fracture or focal lesion.

Sinuses/Orbits: Postsurgical changes left globe, with scleral buckle
and metallic clip identified. Paranasal sinuses are unremarkable.

Other: None.
IMPRESSION: 1. Age indeterminate ischemic changes within the bilateral frontal
subcortical white matter, left parietal subcortical white matter,
and inferior right cerebellar hemisphere.
2. No evidence of acute hemorrhage.
3. Postsurgical changes left globe.

## 2021-07-25 IMAGING — MR MR HEAD W/O CM
5 of 10 series · 25 of 48 positions shown · IV contrast (agent unspecified)
Comparison: None
COMPARISON: None

Addendum:
CLINICAL DATA: Stroke, follow up; neuro deficit, acute, stroke
suspected

EXAM:
MRI HEAD WITHOUT CONTRAST
MRA HEAD WITHOUT CONTRAST
MRA NECK WITHOUT AND WITH CONTRAST
TECHNIQUE: Multiplanar, multi-echo pulse sequences of the brain and surrounding
structures were acquired without intravenous contrast. Angiographic
images of the Circle of Willis were acquired using MRA technique
without intravenous contrast. Angiographic images of the neck were
acquired using MRA technique without and with intravenous contrast.
Carotid stenosis measurements (when applicable) are obtained
utilizing NASCET criteria, using the distal internal carotid
diameter as the denominator.

[Series 2: DWI · axial · 3.0mm · 0.94mm/px · z∈[-42,+101]mm · 9 of 100 slices shown (1 of 2)]
[im 1/100]
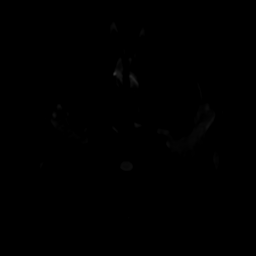
[im 13/100]
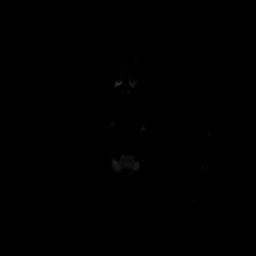
[im 25/100]
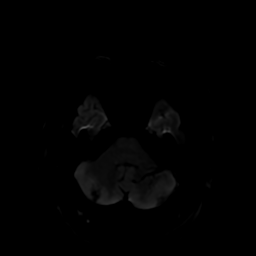
[im 38/100]
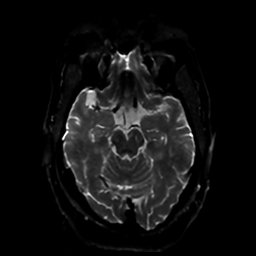
[im 50/100]
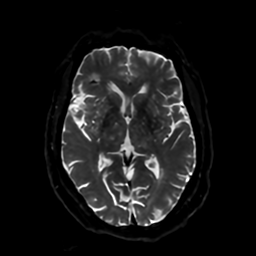
[im 62/100]
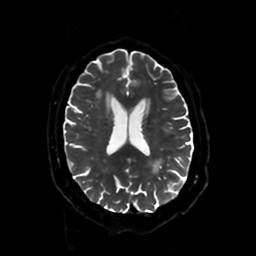
[im 75/100]
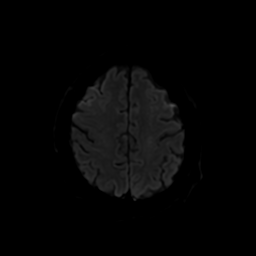
[im 87/100]
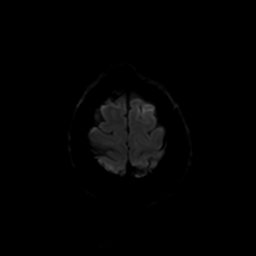
[im 100/100]
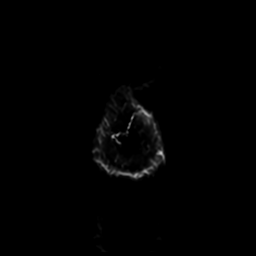

[Series 3: DWI · coronal · 4.0mm · 0.94mm/px · 7 of 76 slices shown (2 of 2)]
[im 1/76]
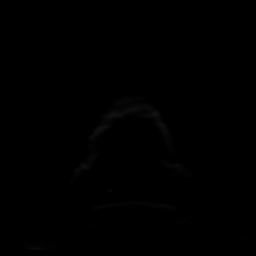
[im 13/76]
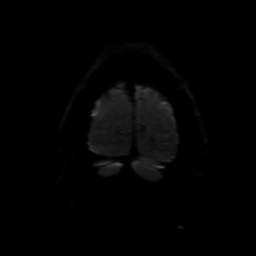
[im 26/76]
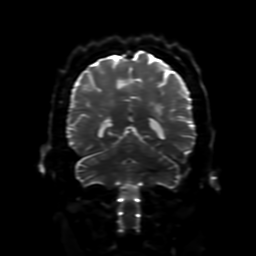
[im 38/76]
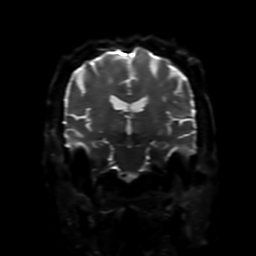
[im 51/76]
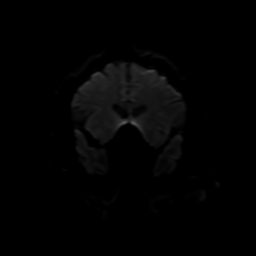
[im 63/76]
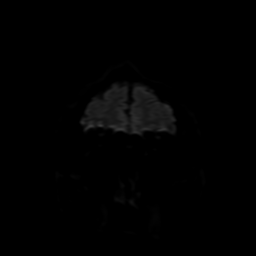
[im 76/76]
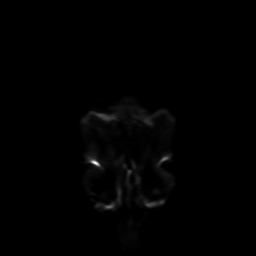

[Series 4: FLAIR · sagittal · 5.0mm · 0.23mm/px · 2 of 24 slices shown (1 of 2)]
[im 1/24]
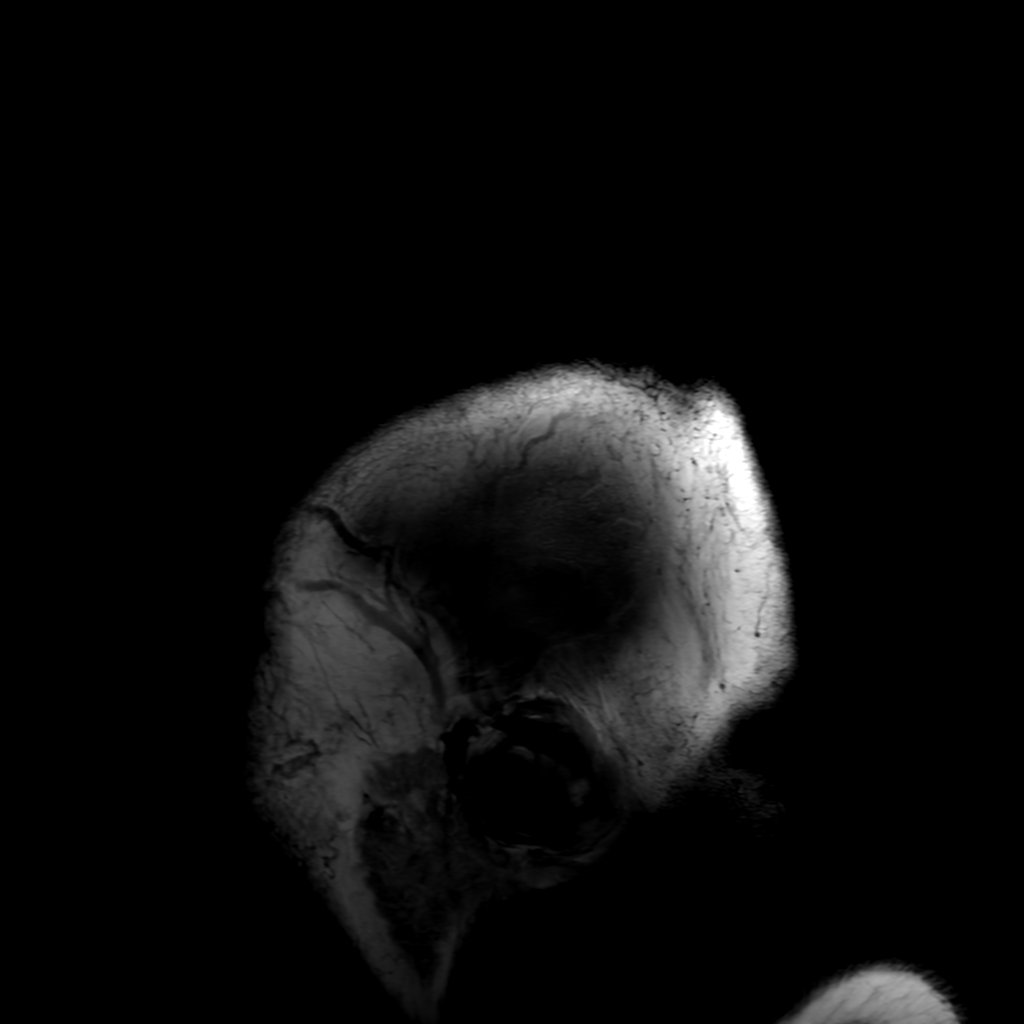
[im 24/24]
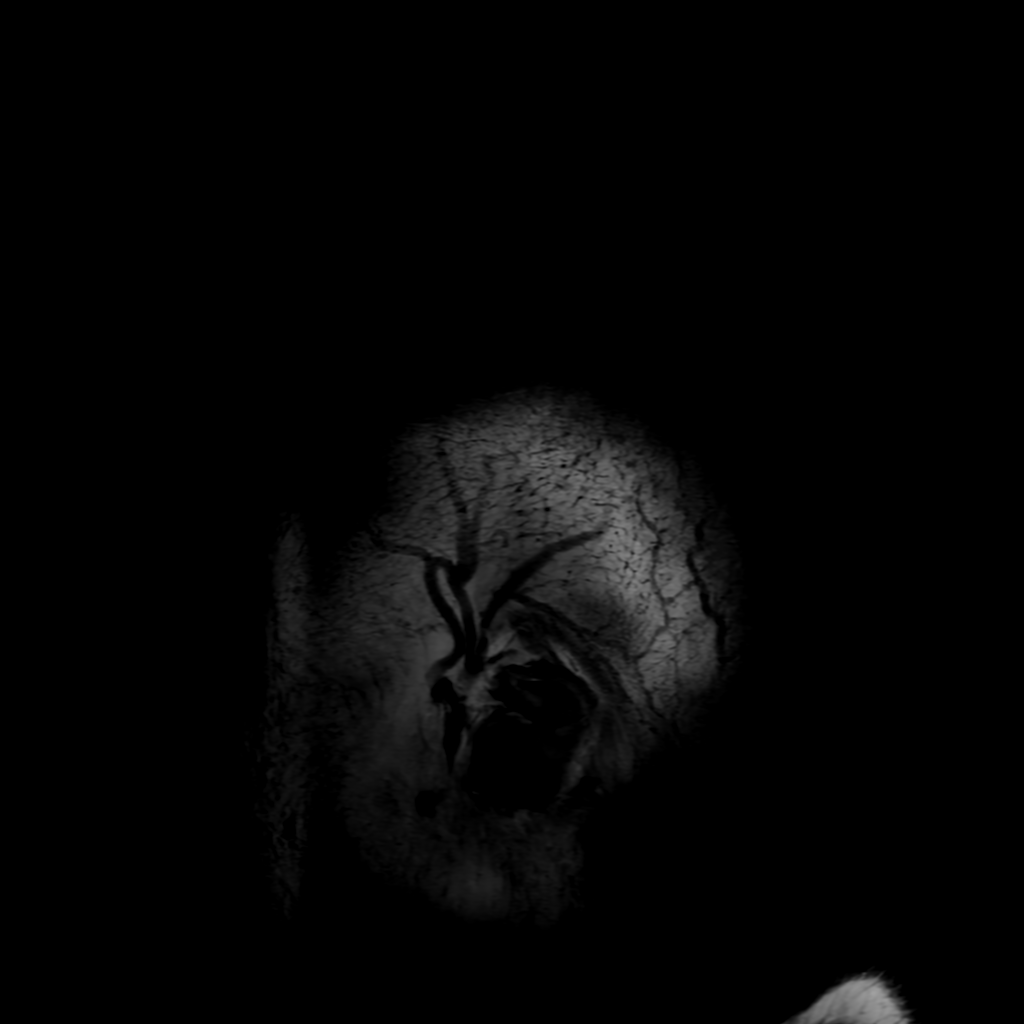

[Series 6: FLAIR · axial · 4.0mm · 0.45mm/px · z∈[-46,+100]mm · 3 of 35 slices shown (2 of 2)]
[im 1/35]
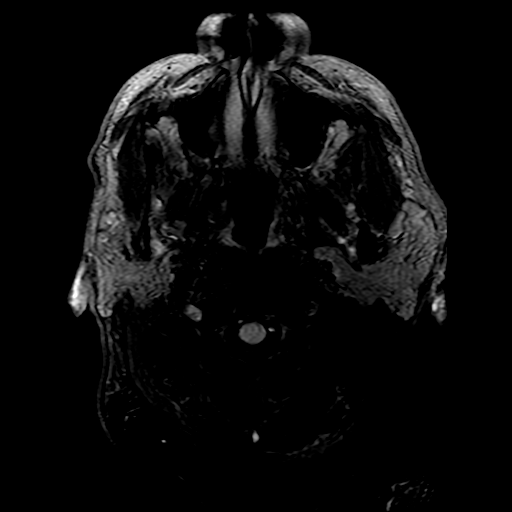
[im 18/35]
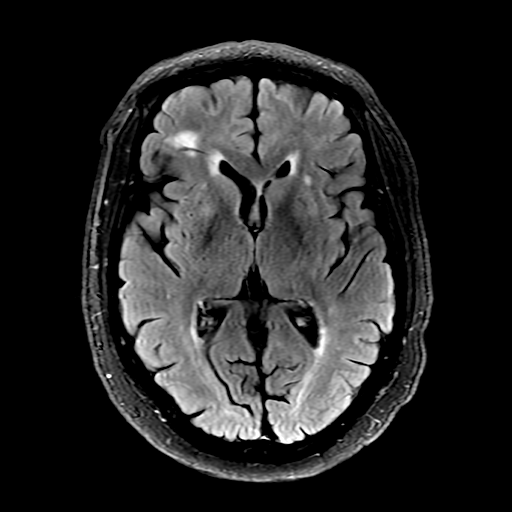
[im 35/35]
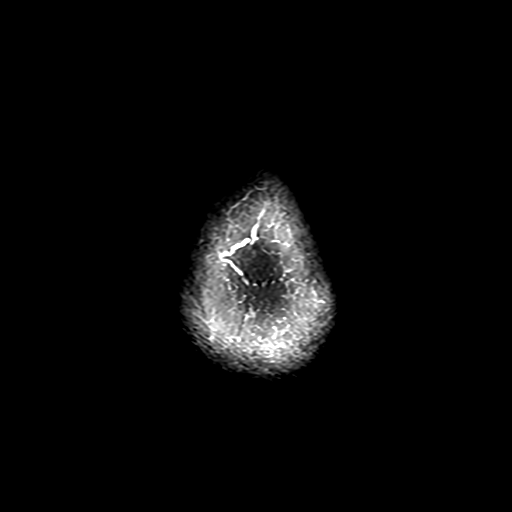

[Series 250: ADC · axial · 3.0mm · 0.94mm/px · z∈[-42,+63]mm · 4 of 50 slices shown]
[im 1/50]
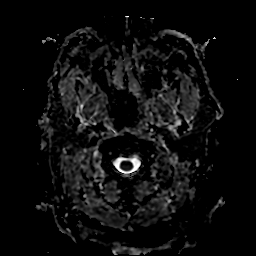
[im 13/50]
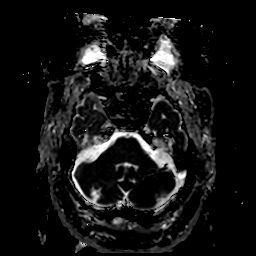
[im 25/50]
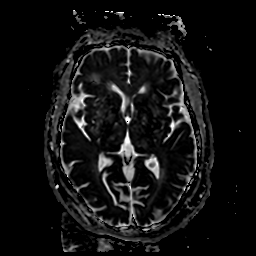
[im 37/50]
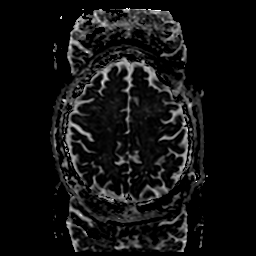

[25 of 48 positions shown; findings below may reference images not displayed]

FINDINGS: MRI HEAD

Brain: Small focus of reduced diffusion is present along the right
ventral medulla (series 2, image 10). There is no evidence of
intracranial hemorrhage. Prominent left cerebellar developmental
venous anomaly. Additional right frontal developmental venous
anomaly with some associated T2 hyperintensity that may reflect
gliosis less likely edema from venous congestion. Patchy and
confluent areas of T2 hyperintensity in the supratentorial white
matter are nonspecific but may reflect mild to moderate chronic
microvascular ischemic changes. Scattered prominent perivascular
spaces and/or chronic small vessel infarcts.

There is no intracranial mass, mass effect, or edema. There is no
hydrocephalus or extra-axial fluid collection.

Vascular: Major vessel flow voids at the skull base are preserved.

Skull and upper cervical spine: Normal marrow signal is preserved.

Sinuses/Orbits: Paranasal sinuses are aerated. Orbits are
unremarkable.

Other: Sella is unremarkable.  Mastoid air cells are clear.

MRA HEAD

Intracranial internal carotid arteries are patent with
atherosclerotic irregularity. Middle and anterior cerebral arteries
are patent. There is diminished flow related enhancement of the
intracranial right vertebral artery. On the post-contrast neck
imaging, there is irregularity of the intracranial vertebral artery
with high-grade stenosis distally. Intracranial left vertebral
arteries, basilar artery, posterior cerebral arteries are patent.
Bilateral posterior communicating arteries are present. There is no
significant stenosis or aneurysm.

MRA NECK

Common, internal, and external carotid arteries are patent. No
hemodynamically significant stenosis. Retropharyngeal course of the
right ICA.

Dominant left vertebral artery is patent. Right vertebral artery is
patent.
IMPRESSION: Suspected small acute infarct of the right ventral medulla.

Mild to moderate chronic microvascular ischemic changes.

Irregularity of the intracranial right vertebral artery with
high-grade stenosis distally.

ADDENDUM:
Contrast dose is 10 mL Gadavist

*** End of Addendum ***
FINDINGS: MRI HEAD

Brain: Small focus of reduced diffusion is present along the right
ventral medulla (series 2, image 10). There is no evidence of
intracranial hemorrhage. Prominent left cerebellar developmental
venous anomaly. Additional right frontal developmental venous
anomaly with some associated T2 hyperintensity that may reflect
gliosis less likely edema from venous congestion. Patchy and
confluent areas of T2 hyperintensity in the supratentorial white
matter are nonspecific but may reflect mild to moderate chronic
microvascular ischemic changes. Scattered prominent perivascular
spaces and/or chronic small vessel infarcts.

There is no intracranial mass, mass effect, or edema. There is no
hydrocephalus or extra-axial fluid collection.

Vascular: Major vessel flow voids at the skull base are preserved.

Skull and upper cervical spine: Normal marrow signal is preserved.

Sinuses/Orbits: Paranasal sinuses are aerated. Orbits are
unremarkable.

Other: Sella is unremarkable.  Mastoid air cells are clear.

MRA HEAD

Intracranial internal carotid arteries are patent with
atherosclerotic irregularity. Middle and anterior cerebral arteries
are patent. There is diminished flow related enhancement of the
intracranial right vertebral artery. On the post-contrast neck
imaging, there is irregularity of the intracranial vertebral artery
with high-grade stenosis distally. Intracranial left vertebral
arteries, basilar artery, posterior cerebral arteries are patent.
Bilateral posterior communicating arteries are present. There is no
significant stenosis or aneurysm.

MRA NECK

Common, internal, and external carotid arteries are patent. No
hemodynamically significant stenosis. Retropharyngeal course of the
right ICA.

Dominant left vertebral artery is patent. Right vertebral artery is
patent.
IMPRESSION: Suspected small acute infarct of the right ventral medulla.

Mild to moderate chronic microvascular ischemic changes.

Irregularity of the intracranial right vertebral artery with
high-grade stenosis distally.

## 2021-07-25 IMAGING — MR MR MRA HEAD W/O CM
2 series · 16 of 48 positions shown · IV contrast (agent unspecified)
Comparison: None
COMPARISON: None

Addendum:
CLINICAL DATA: Stroke, follow up; neuro deficit, acute, stroke
suspected

EXAM:
MRI HEAD WITHOUT CONTRAST
MRA HEAD WITHOUT CONTRAST
MRA NECK WITHOUT AND WITH CONTRAST
TECHNIQUE: Multiplanar, multi-echo pulse sequences of the brain and surrounding
structures were acquired without intravenous contrast. Angiographic
images of the Circle of Willis were acquired using MRA technique
without intravenous contrast. Angiographic images of the neck were
acquired using MRA technique without and with intravenous contrast.
Carotid stenosis measurements (when applicable) are obtained
utilizing NASCET criteria, using the distal internal carotid
diameter as the denominator.

[Series 2: ax (id) · axial · 1.0mm · 0.43mm/px · z∈[-70,+34]mm · 14 of 221 slices shown]
[im 1/221]
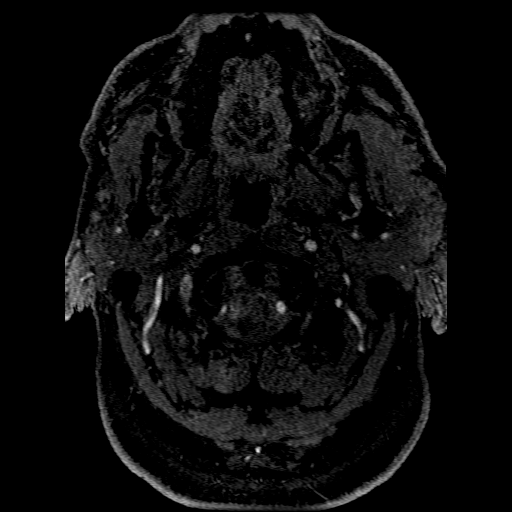
[im 5/221]
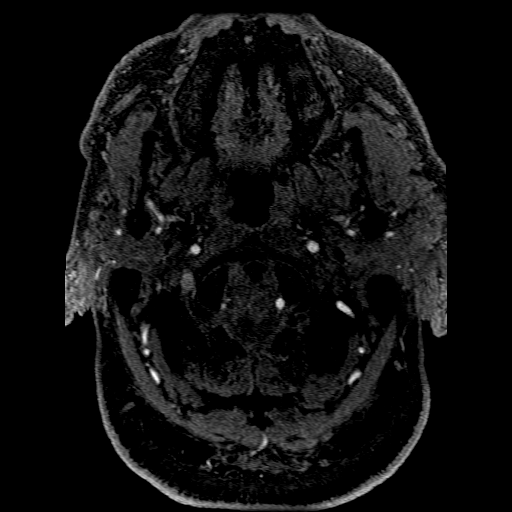
[im 10/221]
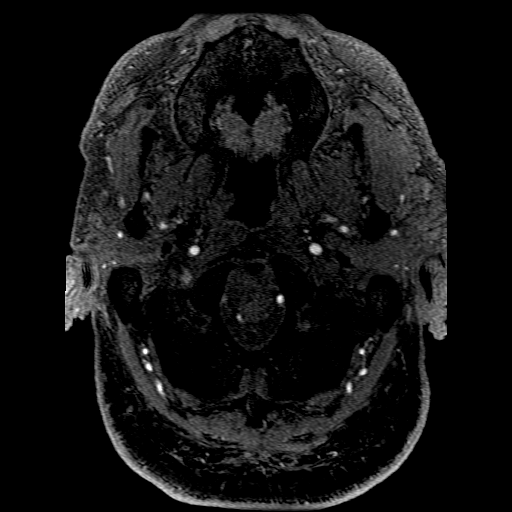
[im 15/221]
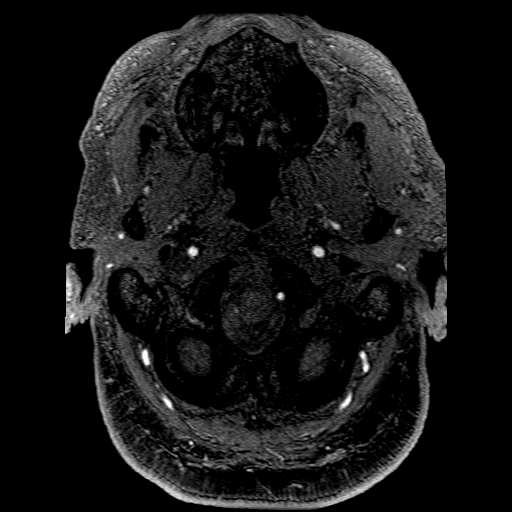
[im 35/221]
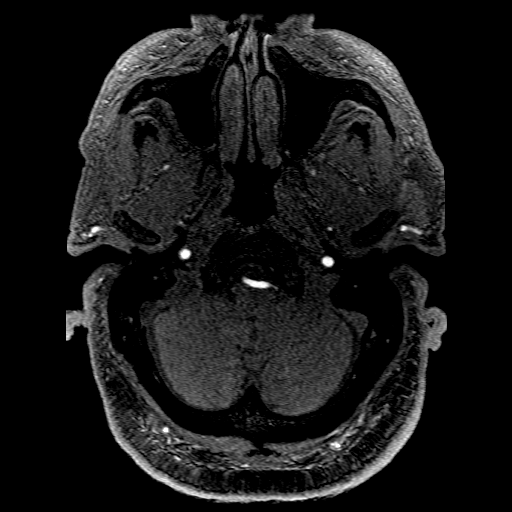
[im 40/221]
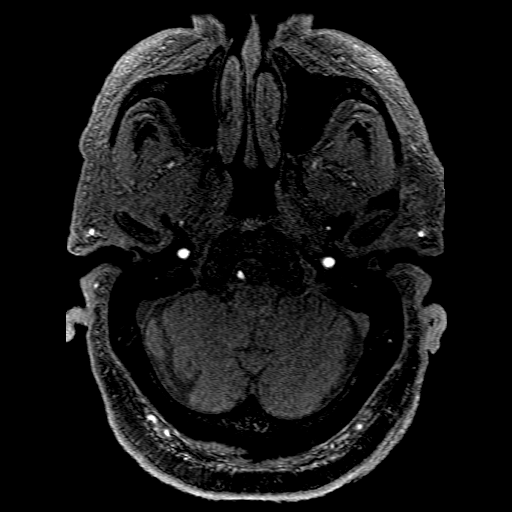
[im 69/221]
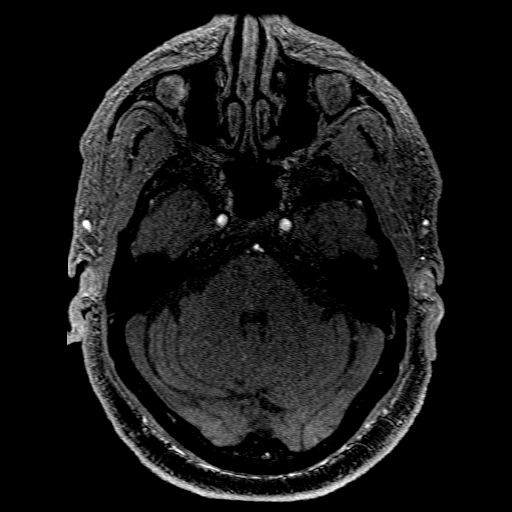
[im 98/221]
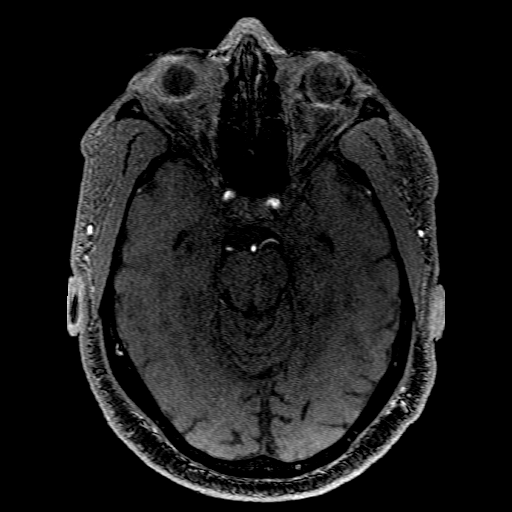
[im 113/221]
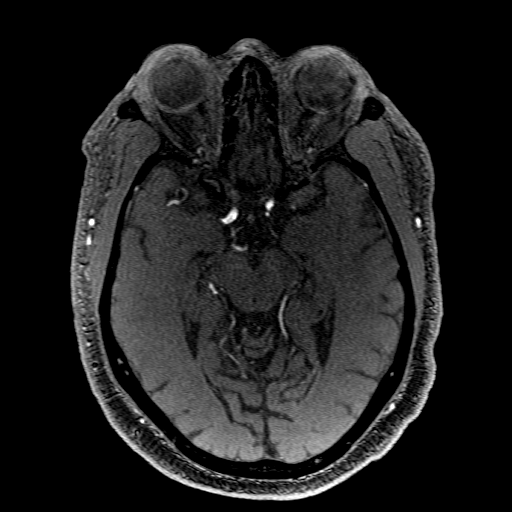
[im 123/221]
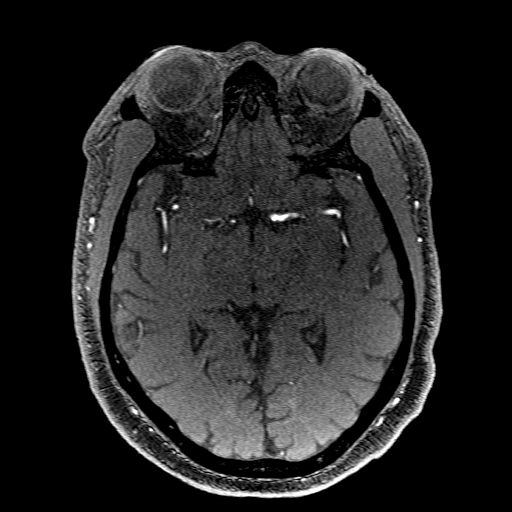
[im 152/221]
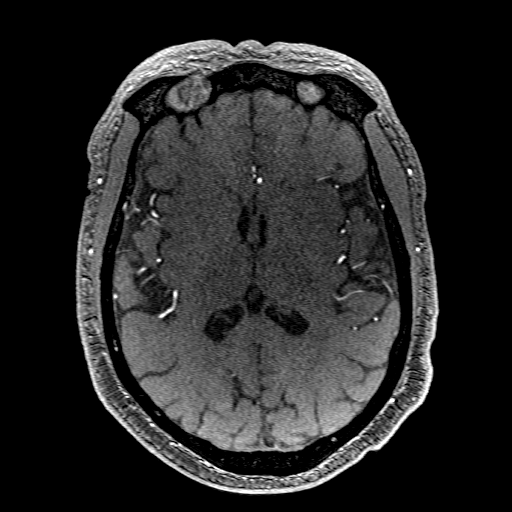
[im 181/221]
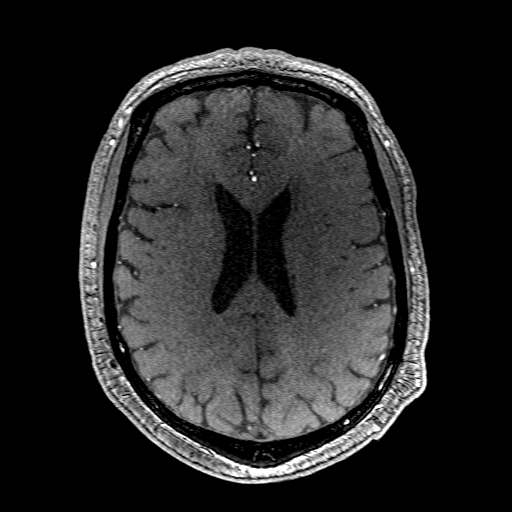
[im 186/221]
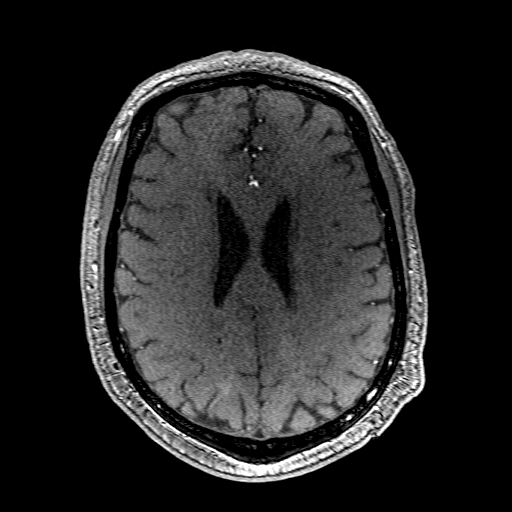
[im 211/221]
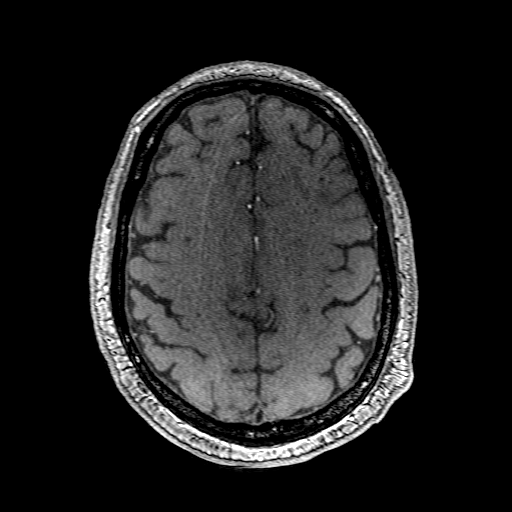

[Series 201: pjn:ax (id) · sagittal · 1.0mm · 0.43mm/px · 2 of 9 slices shown]
[im 1/9]
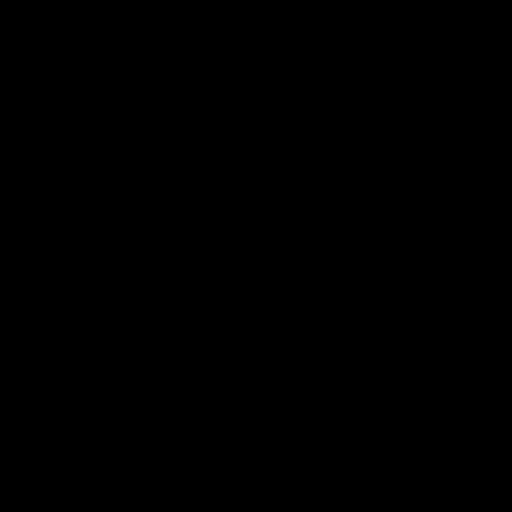
[im 9/9]
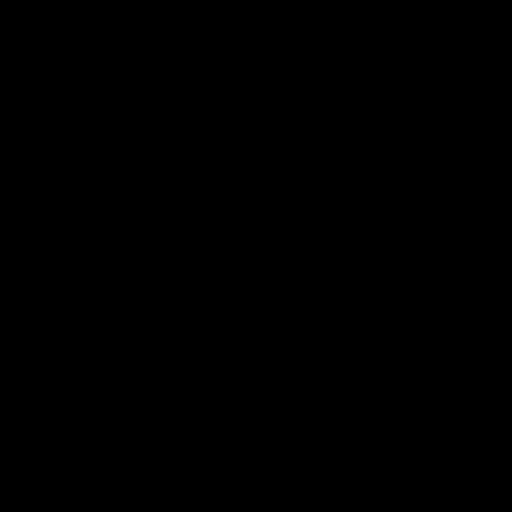

[16 of 48 positions shown; findings below may reference images not displayed]

FINDINGS: MRI HEAD

Brain: Small focus of reduced diffusion is present along the right
ventral medulla (series 2, image 10). There is no evidence of
intracranial hemorrhage. Prominent left cerebellar developmental
venous anomaly. Additional right frontal developmental venous
anomaly with some associated T2 hyperintensity that may reflect
gliosis less likely edema from venous congestion. Patchy and
confluent areas of T2 hyperintensity in the supratentorial white
matter are nonspecific but may reflect mild to moderate chronic
microvascular ischemic changes. Scattered prominent perivascular
spaces and/or chronic small vessel infarcts.

There is no intracranial mass, mass effect, or edema. There is no
hydrocephalus or extra-axial fluid collection.

Vascular: Major vessel flow voids at the skull base are preserved.

Skull and upper cervical spine: Normal marrow signal is preserved.

Sinuses/Orbits: Paranasal sinuses are aerated. Orbits are
unremarkable.

Other: Sella is unremarkable.  Mastoid air cells are clear.

MRA HEAD

Intracranial internal carotid arteries are patent with
atherosclerotic irregularity. Middle and anterior cerebral arteries
are patent. There is diminished flow related enhancement of the
intracranial right vertebral artery. On the post-contrast neck
imaging, there is irregularity of the intracranial vertebral artery
with high-grade stenosis distally. Intracranial left vertebral
arteries, basilar artery, posterior cerebral arteries are patent.
Bilateral posterior communicating arteries are present. There is no
significant stenosis or aneurysm.

MRA NECK

Common, internal, and external carotid arteries are patent. No
hemodynamically significant stenosis. Retropharyngeal course of the
right ICA.

Dominant left vertebral artery is patent. Right vertebral artery is
patent.
IMPRESSION: Suspected small acute infarct of the right ventral medulla.

Mild to moderate chronic microvascular ischemic changes.

Irregularity of the intracranial right vertebral artery with
high-grade stenosis distally.

ADDENDUM:
Contrast dose is 10 mL Gadavist

*** End of Addendum ***
FINDINGS: MRI HEAD

Brain: Small focus of reduced diffusion is present along the right
ventral medulla (series 2, image 10). There is no evidence of
intracranial hemorrhage. Prominent left cerebellar developmental
venous anomaly. Additional right frontal developmental venous
anomaly with some associated T2 hyperintensity that may reflect
gliosis less likely edema from venous congestion. Patchy and
confluent areas of T2 hyperintensity in the supratentorial white
matter are nonspecific but may reflect mild to moderate chronic
microvascular ischemic changes. Scattered prominent perivascular
spaces and/or chronic small vessel infarcts.

There is no intracranial mass, mass effect, or edema. There is no
hydrocephalus or extra-axial fluid collection.

Vascular: Major vessel flow voids at the skull base are preserved.

Skull and upper cervical spine: Normal marrow signal is preserved.

Sinuses/Orbits: Paranasal sinuses are aerated. Orbits are
unremarkable.

Other: Sella is unremarkable.  Mastoid air cells are clear.

MRA HEAD

Intracranial internal carotid arteries are patent with
atherosclerotic irregularity. Middle and anterior cerebral arteries
are patent. There is diminished flow related enhancement of the
intracranial right vertebral artery. On the post-contrast neck
imaging, there is irregularity of the intracranial vertebral artery
with high-grade stenosis distally. Intracranial left vertebral
arteries, basilar artery, posterior cerebral arteries are patent.
Bilateral posterior communicating arteries are present. There is no
significant stenosis or aneurysm.

MRA NECK

Common, internal, and external carotid arteries are patent. No
hemodynamically significant stenosis. Retropharyngeal course of the
right ICA.

Dominant left vertebral artery is patent. Right vertebral artery is
patent.
IMPRESSION: Suspected small acute infarct of the right ventral medulla.

Mild to moderate chronic microvascular ischemic changes.

Irregularity of the intracranial right vertebral artery with
high-grade stenosis distally.

## 2021-07-25 MED ORDER — ENOXAPARIN SODIUM 40 MG/0.4ML IJ SOSY
40.0000 mg | PREFILLED_SYRINGE | INTRAMUSCULAR | Status: DC
  Administered 2021-07-26 – 2021-07-27 (×2): 40 mg via SUBCUTANEOUS
  Filled 2021-07-25 (×2): qty 0.4

## 2021-07-25 MED ORDER — ATORVASTATIN CALCIUM 80 MG PO TABS
80.0000 mg | ORAL_TABLET | Freq: Every day | ORAL | Status: DC
  Administered 2021-07-25 – 2021-07-28 (×4): 80 mg via ORAL
  Filled 2021-07-25 (×4): qty 1

## 2021-07-25 MED ORDER — STROKE: EARLY STAGES OF RECOVERY BOOK: Freq: Once | Status: DC

## 2021-07-25 MED ORDER — ACETAMINOPHEN 650 MG RE SUPP: 650.0000 mg | RECTAL | Status: DC | PRN

## 2021-07-25 MED ORDER — SODIUM CHLORIDE 0.9% FLUSH
3.0000 mL | Freq: Once | INTRAVENOUS | Status: AC
  Administered 2021-07-25: 3 mL via INTRAVENOUS

## 2021-07-25 MED ORDER — SODIUM CHLORIDE 0.9 % IV SOLN: INTRAVENOUS | Status: DC

## 2021-07-25 MED ORDER — ACETAMINOPHEN 325 MG PO TABS: 650.0000 mg | ORAL_TABLET | ORAL | Status: DC | PRN

## 2021-07-25 MED ORDER — ACETAMINOPHEN 160 MG/5ML PO SOLN: 650.0000 mg | ORAL | Status: DC | PRN

## 2021-07-25 MED ORDER — ASPIRIN 325 MG PO TABS
325.0000 mg | ORAL_TABLET | Freq: Once | ORAL | Status: AC
  Administered 2021-07-25: 325 mg via ORAL
  Filled 2021-07-25: qty 1

## 2021-07-25 MED ORDER — GADOBUTROL 1 MMOL/ML IV SOLN
10.0000 mL | Freq: Once | INTRAVENOUS | Status: AC | PRN
  Administered 2021-07-25: 10 mL via INTRAVENOUS

## 2021-07-25 MED ORDER — CLOPIDOGREL BISULFATE 75 MG PO TABS
75.0000 mg | ORAL_TABLET | Freq: Every day | ORAL | Status: DC
  Administered 2021-07-25 – 2021-07-28 (×4): 75 mg via ORAL
  Filled 2021-07-25 (×4): qty 1

## 2021-07-25 MED ORDER — SENNOSIDES-DOCUSATE SODIUM 8.6-50 MG PO TABS: 1.0000 | ORAL_TABLET | Freq: Every evening | ORAL | Status: DC | PRN
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
## 2021-07-25 NOTE — ED Provider Notes (Signed)
[facility]   CSN: [id number] Arrival date & time: 07/25/21  1313     History  Chief complaint: Numbness, dizziness, weakness  Tyler Alvarado is a 76 y.o. male.  HPI Patient has a history of hypertension and keratoconus.  Patient states he started having the symptoms yesterday at about 5 AM.  Patient states he has had some issues with feeling off balance.  He is feeling dizzy and unsteady.  He also feels like the left side of his body is not functioning the way the right side of his body is.  He is having some numbness on that side and it feels weaker.  Patient has not noticed issues with his speech.  Patient has issues with his vision and at baseline can see light and is able to sometimes distinguish some objects but has not noticed any acute changes with that.  Patient also has some urinary frequency as well.  He denies any headache.  No fevers or chills.  Home Medications Prior to Admission medications   Medication Sig Start Date End Date Taking? Authorizing Provider  glucosamine-chondroitin 500-400 MG tablet Take 1 tablet by mouth 3 (three) times daily.    [provider]  oxaprozin (DAYPRO) 600 MG tablet Take one twice daily for pain and inflammation in knee.  Take with food. 11/20/12   Peyton NajjarHopper, David H, MD      Allergies    Patient has no known allergies.    Review of Systems   Review of Systems  Respiratory:  Negative for shortness of breath.   Cardiovascular:  Negative for chest pain.   Physical Exam Updated Vital Signs BP (!) 155/85    Pulse 72    Temp 97.8 F (36.6 C)    Resp 16    SpO2 99%  Physical Exam Vitals and nursing note reviewed.  Constitutional:      General: He is not in acute distress.    Appearance: He is well-developed.  HENT:     Head: Normocephalic and atraumatic.     Right Ear: External ear normal.     Left Ear: External ear normal.  Eyes:     General: No scleral icterus.        Right eye: No discharge.        Left eye: No discharge.     Conjunctiva/sclera:     Right eye: Right conjunctiva is injected.     Comments: Cataract left eye  Neck:     Trachea: No tracheal deviation.  Cardiovascular:     Rate and Rhythm: Normal rate and regular rhythm.  Pulmonary:     Effort: Pulmonary effort is normal. No respiratory distress.     Breath sounds: Normal breath sounds. No stridor. No wheezing or rales.  Abdominal:     General: Bowel sounds are normal. There is no distension.     Palpations: Abdomen is soft.     Tenderness: There is no abdominal tenderness. There is no guarding or rebound.  Musculoskeletal:        General: No tenderness.     Cervical back: Neck supple.  Skin:    General: Skin is warm and dry.     Findings: No rash.  Neurological:     Mental Status: He is alert and oriented to person, place, and time.     Cranial Nerves: No cranial nerve deficit.     Sensory: No sensory deficit.     Motor: No abnormal muscle  tone or seizure activity.     Coordination: Coordination normal.     Comments: No pronator drift bilateral upper extrem, able to hold both legs off bed for 5 seconds, decreased sensation left upper and left lower extremity,  no left or right sided neglect, , nystagmus noted No facial droop, tongue midline    ED Results / Procedures / Treatments   Labs (all labs ordered are listed, but only abnormal results are displayed) Labs Reviewed  COMPREHENSIVE METABOLIC PANEL - Abnormal; Notable for the following components:      Result Value   Sodium 132 (*)    Calcium 8.5 (*)    Total Protein 8.5 (*)    Anion gap 4 (*)    All other components within normal limits  I-STAT CHEM 8, ED - Abnormal; Notable for the following components:   Calcium, Ion 1.13 (*)    All other components within normal limits  PROTIME-INR  APTT  CBC  DIFFERENTIAL  CBG MONITORING, ED    EKG EKG Interpretation  Date/Time:  Saturday July 25 2021 13:40:29  EST Ventricular Rate:  82 PR Interval:  182 QRS Duration: 80 QT Interval:  368 QTC Calculation: 429 R Axis:   76 Text Interpretation: Normal sinus rhythm Cannot rule out Anterior infarct , age undetermined Abnormal ECG No previous ECGs available Confirmed by Linwood Dibbles [phone]) on 07/25/2021 3:36:48 PM  Radiology CT HEAD WO CONTRAST ( )  Result Date: 07/25/2021 CLINICAL DATA:  Dizziness and left-sided weakness since 5 a.m. yesterday EXAM: CT HEAD WITHOUT CONTRAST TECHNIQUE: Contiguous axial images were obtained from the base of the skull through the vertex without intravenous contrast. RADIATION DOSE REDUCTION: This exam was performed according to the departmental dose-optimization program which includes automated exposure control, adjustment of the mA and/or kV according to patient size and/or use of iterative reconstruction technique. COMPARISON:  None. FINDINGS: Brain: Subcortical white matter hypodensities are seen within the bilateral frontal regions and left parietal region, consistent with age-indeterminate small vessel ischemic change. Focal hypodensity inferior right cerebellar hemisphere consistent with age indeterminate cerebellar infarct. No other signs of acute infarct or hemorrhage. Lateral ventricles and midline structures are unremarkable. No acute extra-axial fluid collections. No mass effect. Vascular: No hyperdense vessel or unexpected calcification. Skull: Normal. Negative for fracture or focal lesion. Sinuses/Orbits: Postsurgical changes left globe, with scleral buckle and metallic clip identified. Paranasal sinuses are unremarkable. Other: None. IMPRESSION: 1. Age indeterminate ischemic changes within the bilateral frontal subcortical white matter, left parietal subcortical white matter, and inferior right cerebellar hemisphere. 2. No evidence of acute hemorrhage. 3. Postsurgical changes left globe. Electronically Signed   By: Sharlet Salina M.D.   On: 07/25/2021 15:31     Procedures Procedures    Medications Ordered in ED Medications  sodium chloride flush (NS) 0.9 % injection 3 mL (has no administration in time range)    ED Course/ Medical Decision Making/ A&P Clinical Course as of 07/26/21 1503  Sat Jul 25, 2021  1535 I-stat chem 8, ED(!) Normal [JK]  1535 Comprehensive metabolic panel(!) Normal except calcium and sodium level slightly decreased [JK]  1535 CBC Normal [JK]  1536 Head CT shows age-indeterminate white matter changes.  Images and radiology report reviewed [JK]  1656 DIscussed with Neruology, Dr Otelia Limes.  Requests call back if MRI is positive. [JK]  1708 Discussed with medical service regarding admission [JK]    Clinical Course User Index [JK] Linwood Dibbles, MD  Medical Decision Making  Patient presents with acute weakness and dizziness.  Symptoms are primarily on the left side.  No signs of anemia or dehydration.  Normal cardiac rhythm noted on EKG and monitor.  Patient's symptoms concerning for the possibility of TIA stroke.  CT scan of his head does show abnormalities concerning for the possibility of stroke.  Patient is outside any acute intervention window.  He is outside the tPA window and is also LVO negative.  Will plan on MRI for further evaluation.  I consulted with neurology and they wish to be notified if the MRI is positive.  I will consult with medical service for further work-up.        Final Clinical Impression(s) / ED Diagnoses Final diagnoses:  Dizziness  Weakness      Linwood Dibbles, MD 07/26/21 1504

## 2021-07-25 NOTE — ED Notes (Signed)
Patient transported to MRI 

## 2021-07-25 NOTE — ED Triage Notes (Addendum)
Patient reports that he developed dizziness and left sided weakness at 0500 yesterday. Patient reports that he had a hard time controlling his bladder with same. Reports the dizziness with position change. Left foot he reports doesn't work right. 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 and right eye deviation patient states is now normal post eye surgery

## 2021-07-25 NOTE — Consult Note (Signed)
Neurology Consultation  Reason for Consult: Stroke Referring Physician: Dr. Blake Divine  CC: Dizziness, weakness, gait instability  History is obtained from: Patient, chart  HPI: Tyler Alvarado is a 76 y.o. 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 Unclear if he was completely normal prior to going to bed the night prior but the last known well time is 5 AM 07/24/2021. His significant other also noted that his speech was slurred even before the weakness was noted hence the confusion on the last known well. No headaches.  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  His visual acuity is bad in his right eye as well.    LKW: 0500 07/24/2021 tpa given?: no, OSW Premorbid modified Rankin scale (mRS): 3-due to blindness   ROS: Full ROS was performed and is negative except as noted in the HPI.  Past Medical History:  Diagnosis Date   Hypertension     History reviewed. No pertinent family history.  Social History:   reports that he has never smoked. He does not have any smokeless tobacco history on file. He reports that he does not drink alcohol and does not use drugs.  Medications  Current Facility-Administered Medications:     stroke: mapping our early stages of recovery book, , Does not apply, Once, Kathlen Mody, MD   0.9 %  sodium chloride infusion, , Intravenous, Continuous, Kathlen Mody, MD   acetaminophen (TYLENOL) tablet 650 mg, 650 mg, Oral, Q4H PRN **OR** acetaminophen (TYLENOL) 160 MG/5ML solution 650 mg, 650 mg, Per Tube, Q4H PRN **OR** acetaminophen (TYLENOL) suppository 650 mg, 650 mg,  Rectal, Q4H PRN, Kathlen Mody, MD   aspirin tablet 325 mg, 325 mg, Oral, Once, Kathlen Mody, MD   enoxaparin (LOVENOX) injection 40 mg, 40 mg, Subcutaneous, Q24H, Akula, Vijaya, MD   senna-docusate (Senokot-S) tablet 1 tablet, 1 tablet, Oral, QHS PRN, Kathlen Mody, MD  Current Outpatient Medications:    amLODipine (NORVASC) 10 MG tablet, Take 10 mg by mouth daily., Disp: , Rfl:    oxaprozin (DAYPRO) 600 MG tablet, Take one twice daily for pain and inflammation in knee.  Take with food. (Patient not taking: Reported on 07/25/2021), Disp: 30 tablet, Rfl: 1  Exam: Current vital signs: BP (!) 127/92    Pulse 68    Temp 97.8 F (36.6 C)    Resp 17    SpO2 93%  Vital signs in last 24 hours: Temp:  [97.8 F (36.6 C)] 97.8 F (36.6 C) (01/14 1318) Pulse Rate:  [68-91] 68 (01/14 1800) Resp:  [14-22] 17 (01/14 1800) BP: (127-179)/(78-113) 127/92 (01/14 1800) SpO2:  [93 %-99 %] 93 % (01/14 1800) Awake alert and some distress due to pain in the left hip HEENT: Normocephalic/atraumatic, left corneal opacity and irregular nystagmus noted CVS: Regular rhythm Respiratory: Breathing well saturating normally on room air with clear chest Abdomen nondistended nontender Extremities warm well perfused Neurological exam Awake alert oriented x3 Speech is mildly dysarthric No evidence of aphasia Cranials: Right pupil is round and reactive to light, left pupil cannot be visualized due to corneal opacity, there is an irregular haphazard nystagmus present in all directions of his gaze, his visual acuity  on the right eye is extremely reduced to light perception, he cannot see from his left eye at all, facial sensation intact, face symmetric, tongue and palate midline. Motor examination reveals no obvious drift but subtle left upper extremity weakness in comparison to the right. Sensation: Diminished with paresthesias on the left in comparison to the right Coordination with mild dysmetria on the left but  difficult examination due to poor visual acuity. 1a Level of Conscious.: 0 1b LOC Questions: 0 1c LOC Commands: 0 2 Best Gaze: 0 3 Visual: 0 4 Facial Palsy: 0 5a Motor Arm - left: 0 5b Motor Arm - Right: 0 6a Motor Leg - Left: 0 6b Motor Leg - Right: 0 7 Limb Ataxia: 0 8 Sensory: 1 9 Best Language: 0 10 Dysarthria: 1 11 Extinct. and Inatten.:  TOTAL: 2   Labs I have reviewed labs in epic and the results pertinent to this consultation are:  CBC    Component Value Date/Time   WBC 6.1 07/25/2021 1401   RBC 4.69 07/25/2021 1401   HGB 15.6 07/25/2021 1403   HCT 46.0 07/25/2021 1403   PLT 248 07/25/2021 1401   MCV 92.3 07/25/2021 1401   MCH 29.6 07/25/2021 1401   MCHC 32.1 07/25/2021 1401   RDW 14.7 07/25/2021 1401   LYMPHSABS 1.9 07/25/2021 1401   MONOABS 0.6 07/25/2021 1401   EOSABS 0.0 07/25/2021 1401   BASOSABS 0.0 07/25/2021 1401    CMP     Component Value Date/Time   NA 139 07/25/2021 1403   K 3.7 07/25/2021 1403   CL 102 07/25/2021 1403   CO2 24 07/25/2021 1401   GLUCOSE 97 07/25/2021 1403   BUN 15 07/25/2021 1403   CREATININE 0.90 07/25/2021 1403   CALCIUM 8.5 (L) 07/25/2021 1401   PROT 8.5 (H) 07/25/2021 1401   ALBUMIN 3.6 07/25/2021 1401   AST 17 07/25/2021 1401   ALT 21 07/25/2021 1401   ALKPHOS 78 07/25/2021 1401   BILITOT 0.7 07/25/2021 1401   GFRNONAA >60 07/25/2021 1401   Imaging I have reviewed the images obtained:  CT-head-age-indeterminate ischemic changes within bilateral frontal subcortical white matter, left parietal subcortical white matter inferior right cerebellar hemisphere with no acute events of acute hemorrhage.  Postsurgical changes in the left lobe seen. 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 Outside the window for intervention at this time with last known well more than 24 hours. Needs admission for stroke risk factor work-up.  Recommendations: Aspirin 325+ Plavix 75 Daily Atorvastatin 80 Telemetry Frequent neurochecks 2D echo A1c Lipid panel PT Speech therapy OT Permissive hypertension for another day or so-treat only if systolic blood pressures are greater than 220 on a as needed basis. Goal blood pressure on discharge normotension Stroke team will follow with you Plan discussed with Dr. Blake Divine  -- Milon Dikes, MD Neurologist [facility] Pager: [phone]

## 2021-07-26 ENCOUNTER — Inpatient Hospital Stay (HOSPITAL_COMMUNITY): Payer: Medicare Other

## 2021-07-26 DIAGNOSIS — I6389 Other cerebral infarction: Secondary | ICD-10-CM | POA: Diagnosis not present

## 2021-07-26 DIAGNOSIS — I639 Cerebral infarction, unspecified: Secondary | ICD-10-CM

## 2021-07-26 DIAGNOSIS — I10 Essential (primary) hypertension: Secondary | ICD-10-CM

## 2021-07-26 DIAGNOSIS — E785 Hyperlipidemia, unspecified: Secondary | ICD-10-CM

## 2021-07-26 LAB — ECHOCARDIOGRAM COMPLETE
Area-P 1/2: 2.66 cm2
S' Lateral: 2.7 cm

## 2021-07-26 LAB — LIPID PANEL
Cholesterol: 190 mg/dL (ref 0–200)
HDL: 43 mg/dL (ref >40)
LDL Cholesterol: 137 mg/dL — ABNORMAL HIGH (ref 0–99)
Total CHOL/HDL Ratio: 4.4 RATIO
Triglycerides: 51 mg/dL (ref <150)
VLDL: 10 mg/dL (ref 0–40)

## 2021-07-26 LAB — HEMOGLOBIN A1C
Hgb A1c MFr Bld: 6 % — ABNORMAL HIGH (ref 4.8–5.6)
Mean Plasma Glucose: 125.5 mg/dL

## 2021-07-26 MED ORDER — ASPIRIN EC 81 MG PO TBEC: 81.0000 mg | DELAYED_RELEASE_TABLET | Freq: Every day | ORAL | Status: DC

## 2021-07-26 MED ORDER — AMLODIPINE BESYLATE 10 MG PO TABS
10.0000 mg | ORAL_TABLET | Freq: Every day | ORAL | Status: DC
Start: 2021-07-26 — End: 2021-07-28
  Administered 2021-07-27 – 2021-07-28 (×2): 10 mg via ORAL
  Filled 2021-07-26 (×2): qty 1

## 2021-07-26 MED ORDER — ASPIRIN EC 81 MG PO TBEC
81.0000 mg | DELAYED_RELEASE_TABLET | Freq: Every day | ORAL | Status: DC
  Administered 2021-07-26 – 2021-07-28 (×3): 81 mg via ORAL
  Filled 2021-07-26 (×3): qty 1

## 2021-07-26 NOTE — ED Notes (Signed)
Pt back from echo. Providers at bedside

## 2021-07-26 NOTE — ED Notes (Signed)
ED TO INPATIENT HANDOFF REPORT  ED Nurse Name and Phone #: Sammuel Bailiff RN [phone]  S Name/Age/Gender Tyler Alvarado 76 y.o. male Room/Bed: 039C/039C  Code Status   Code Status: Full Code  Home/SNF/Other Home Patient oriented to: self, place, time, and situation Is this baseline? Yes   Triage Complete: Triage complete  Chief Complaint Stroke [facility]) [I63.9]  Triage Note Patient reports that he developed dizziness and left sided weakness at 0500 yesterday. Patient reports that he had a hard time controlling his bladder with same. Reports the dizziness with position change. Left foot he reports doesn't work right. 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 and right eye deviation patient states is now normal post eye surgery   Allergies Allergies  Allergen Reactions   Pork-Derived Products     Pt does not eat any pork    Level of Care/Admitting Diagnosis ED Disposition     ED Disposition  Admit   Condition  --   [place]: [place] [[id number]]  Level of Care: Telemetry Cardiac [103]  May admit patient to [facility] or [facility] if equivalent level of care is available:: Yes  Covid Evaluation: Asymptomatic Screening Protocol (No Symptoms)  [facility]: [id number]  Admitting Physician: Hosie Poisson [[id number]]  Attending Physician: Hosie Poisson [[id number]]  Estimated length of stay: past midnight tomorrow  Certification:: I certify this patient will need inpatient services for at least 2 midnights          B Medical/Surgery History Past Medical History:  Diagnosis Date   Hypertension    Past Surgical History:  Procedure Laterality Date   EYE SURGERY       A IV Location/Drains/Wounds Patient Lines/Drains/Airways Status     Active Line/Drains/Airways     Name Placement date Placement time Site Days   Peripheral IV 07/25/21 18 G Right Antecubital 07/25/21  1553  Antecubital  1            Intake/Output Last 24  hours No intake or output data in the 24 hours ending 07/26/21 1350  Labs/Imaging Results for orders placed or performed during the hospital encounter of 07/25/21 (from the past 48 hour(s))  Protime-INR     Status: None   Collection Time: 07/25/21  2:01 PM  Result Value Ref Range   Prothrombin Time 12.9 11.4 - 15.2 seconds   INR 1.0 0.8 - 1.2    Comment: (NOTE) INR goal varies based on device and disease states. Performed at [facility], [place] [address] [place], [place] [address]   APTT     Status: None   Collection Time: 07/25/21  2:01 PM  Result Value Ref Range   aPTT 28 24 - 36 seconds    Comment: Performed at [facility], [place] [address], [place] [address]  CBC     Status: None   Collection Time: 07/25/21  2:01 PM  Result Value Ref Range   WBC 6.1 4.0 - 10.5 K/uL   RBC 4.69 4.22 - 5.81 MIL/uL   Hemoglobin 13.9 13.0 - 17.0 g/dL   HCT 43.3 39.0 - 52.0 %   MCV 92.3 80.0 - 100.0 fL   MCH 29.6 26.0 - 34.0 pg   MCHC 32.1 30.0 - 36.0 g/dL   RDW 14.7 11.5 - 15.5 %   Platelets 248 150 - 400 K/uL   nRBC 0.0 0.0 - 0.2 %    Comment: Performed at [facility], [address] [place], [place] [address]  Differential     Status: None   Collection Time: 07/25/21  2:01 PM  Result Value Ref Range   Neutrophils Relative % 57 %   Neutro Abs 3.5 1.7 - 7.7 K/uL   Lymphocytes Relative 32 %   Lymphs Abs 1.9 0.7 - 4.0 K/uL   Monocytes Relative 9 %   Monocytes Absolute 0.6 0.1 - 1.0 K/uL   Eosinophils Relative 1 %   Eosinophils Absolute 0.0 0.0 - 0.5 K/uL   Basophils Relative 1 %   Basophils Absolute 0.0 0.0 - 0.1 K/uL   Immature Granulocytes 0 %   Abs Immature Granulocytes 0.01 0.00 - 0.07 K/uL    Comment: Performed at [facility], [address], [place], [place] [address]  Comprehensive metabolic panel     Status: Abnormal   Collection Time: 07/25/21  2:01 PM  Result Value Ref Range   Sodium 132 (L) 135 - 145 mmol/L   Potassium 3.6 3.5 - 5.1  mmol/L   Chloride 104 98 - 111 mmol/L   CO2 24 22 - 32 mmol/L   Glucose, Bld 97 70 - 99 mg/dL    Comment: Glucose reference range applies only to samples taken after fasting for at least 8 hours.   BUN 14 8 - 23 mg/dL   Creatinine, Ser 8.29 0.61 - 1.24 mg/dL   Calcium 8.5 (L) 8.9 - 10.3 mg/dL   Total Protein 8.5 (H) 6.5 - 8.1 g/dL   Albumin 3.6 3.5 - 5.0 g/dL   AST 17 15 - 41 U/L   ALT 21 0 - 44 U/L   Alkaline Phosphatase 78 38 - 126 U/L   Total Bilirubin 0.7 0.3 - 1.2 mg/dL   GFR, Estimated >56 >21 mL/min    Comment: (NOTE) Calculated using the CKD-EPI Creatinine Equation (2021)    Anion gap 4 (L) 5 - 15    Comment: Performed at [facility], [address] [place], [place] [address]  I-stat chem 8, ED     Status: Abnormal   Collection Time: 07/25/21  2:03 PM  Result Value Ref Range   Sodium 139 135 - 145 mmol/L   Potassium 3.7 3.5 - 5.1 mmol/L   Chloride 102 98 - 111 mmol/L   BUN 15 8 - 23 mg/dL   Creatinine, Ser 7.84 0.61 - 1.24 mg/dL   Glucose, Bld 97 70 - 99 mg/dL    Comment: Glucose reference range applies only to samples taken after fasting for at least 8 hours.   Calcium, Ion 1.13 (L) 1.15 - 1.40 mmol/L   TCO2 24 22 - 32 mmol/L   Hemoglobin 15.6 13.0 - 17.0 g/dL   HCT 69.6 29.5 - 28.4 %  Resp Panel by RT-PCR (Flu A&B, Covid) Nasopharyngeal Swab     Status: None   Collection Time: 07/25/21  6:21 PM   Specimen: Nasopharyngeal Swab; Nasopharyngeal(NP) swabs in vial transport medium  Result Value Ref Range   SARS Coronavirus 2 by RT PCR NEGATIVE NEGATIVE    Comment: (NOTE) SARS-CoV-2 target nucleic acids are NOT DETECTED.  The SARS-CoV-2 RNA is generally detectable in upper respiratory specimens during the acute phase of infection. The lowest concentration of SARS-CoV-2 viral copies this assay can detect is 138 copies/mL. A negative result does not preclude SARS-Cov-2 infection and should not be used as the sole basis for treatment or other patient management  decisions. A negative result may occur with  improper specimen collection/handling,  submission of specimen other than nasopharyngeal swab, presence of viral mutation(s) within the areas targeted by this assay, and inadequate number of viral copies(<138 copies/mL). A negative result must be combined with clinical observations, patient history, and epidemiological information. The expected result is Negative.  Fact Sheet for Patients:  EntrepreneurPulse.com.au  Fact Sheet for Healthcare Providers:  IncredibleEmployment.be  This test is no t yet approved or cleared by the [place] FDA and  has been authorized for detection and/or diagnosis of SARS-CoV-2 by FDA under an Emergency Use Authorization (EUA). This EUA will remain  in effect (meaning this test can be used) for the duration of the COVID-19 declaration under Section 564(b)(1) of the Act, 21 U.S.C.section 360bbb-3(b)(1), unless the authorization is terminated  or revoked sooner.       Influenza A by PCR NEGATIVE NEGATIVE   Influenza B by PCR NEGATIVE NEGATIVE    Comment: (NOTE) The Xpert Xpress SARS-CoV-2/FLU/RSV plus assay is intended as an aid in the diagnosis of influenza from Nasopharyngeal swab specimens and should not be used as a sole basis for treatment. Nasal washings and aspirates are unacceptable for Xpert Xpress SARS-CoV-2/FLU/RSV testing.  Fact Sheet for Patients: EntrepreneurPulse.com.au  Fact Sheet for Healthcare Providers: IncredibleEmployment.be  This test is not yet approved or cleared by the [place] FDA and has been authorized for detection and/or diagnosis of SARS-CoV-2 by FDA under an Emergency Use Authorization (EUA). This EUA will remain in effect (meaning this test can be used) for the duration of the COVID-19 declaration under Section 564(b)(1) of the Act, 21 U.S.C. section 360bbb-3(b)(1), unless the authorization  is terminated or revoked.  Performed at [facility], [place] [address], [place], [place] [address]   Hemoglobin A1c     Status: Abnormal   Collection Time: 07/26/21  3:24 AM  Result Value Ref Range   Hgb A1c MFr Bld 6.0 (H) 4.8 - 5.6 %    Comment: (NOTE) Pre diabetes:          5.7%-6.4%  Diabetes:              >6.4%  Glycemic control for   <7.0% adults with diabetes    Mean Plasma Glucose 125.5 mg/dL    Comment: Performed at [facility] [address] [place], [place] [address]  Lipid panel     Status: Abnormal   Collection Time: 07/26/21  3:24 AM  Result Value Ref Range   Cholesterol 190 0 - 200 mg/dL   Triglycerides 51 <150 mg/dL   HDL 43 >40 mg/dL   Total CHOL/HDL Ratio 4.4 RATIO   VLDL 10 0 - 40 mg/dL   LDL Cholesterol 137 (H) 0 - 99 mg/dL    Comment:        Total Cholesterol/HDL:CHD Risk Coronary Heart Disease Risk Table                     Men   Women  1/2 Average Risk   3.4   3.3  Average Risk       5.0   4.4  2 X Average Risk   9.6   7.1  3 X Average Risk  23.4   11.0        Use the calculated Patient Ratio above and the CHD Risk Table to determine the patient's CHD Risk.        ATP III CLASSIFICATION (LDL):  <100     mg/dL   Optimal  100-129  mg/dL  Near or Above                    Optimal  130-159  mg/dL   Borderline  160-189  mg/dL   High  >190     mg/dL   Very High Performed at [facility] [address], [place], [place] [address]    CT HEAD WO CONTRAST (5MM)  Result Date: 07/25/2021 CLINICAL DATA:  Dizziness and left-sided weakness since 5 a.m. yesterday EXAM: CT HEAD WITHOUT CONTRAST TECHNIQUE: Contiguous axial images were obtained from the base of the skull through the vertex without intravenous contrast. RADIATION DOSE REDUCTION: This exam was performed according to the departmental dose-optimization program which includes automated exposure control, adjustment of the mA and/or kV according to patient size and/or use of  iterative reconstruction technique. COMPARISON:  None. FINDINGS: Brain: Subcortical white matter hypodensities are seen within the bilateral frontal regions and left parietal region, consistent with age-indeterminate small vessel ischemic change. Focal hypodensity inferior right cerebellar hemisphere consistent with age indeterminate cerebellar infarct. No other signs of acute infarct or hemorrhage. Lateral ventricles and midline structures are unremarkable. No acute extra-axial fluid collections. No mass effect. Vascular: No hyperdense vessel or unexpected calcification. Skull: Normal. Negative for fracture or focal lesion. Sinuses/Orbits: Postsurgical changes left globe, with scleral buckle and metallic clip identified. Paranasal sinuses are unremarkable. Other: None. IMPRESSION: 1. Age indeterminate ischemic changes within the bilateral frontal subcortical white matter, left parietal subcortical white matter, and inferior right cerebellar hemisphere. 2. No evidence of acute hemorrhage. 3. Postsurgical changes left globe. Electronically Signed   By: Randa Ngo M.D.   On: 07/25/2021 15:31   MR ANGIO HEAD WO CONTRAST  Addendum Date: 07/25/2021   ADDENDUM REPORT: 07/25/2021 19:28 ADDENDUM: Contrast dose is 10 mL Gadavist Electronically Signed   By: Macy Mis M.D.   On: 07/25/2021 19:28   Result Date: 07/25/2021 CLINICAL DATA:  Stroke, follow up; neuro deficit, acute, stroke suspected EXAM: MRI HEAD WITHOUT CONTRAST MRA HEAD WITHOUT CONTRAST MRA NECK WITHOUT AND WITH CONTRAST TECHNIQUE: Multiplanar, multi-echo pulse sequences of the brain and surrounding structures were acquired without intravenous contrast. Angiographic images of the Circle of Willis were acquired using MRA technique without intravenous contrast. Angiographic images of the neck were acquired using MRA technique without and with intravenous contrast. Carotid stenosis measurements (when applicable) are obtained utilizing NASCET criteria,  using the distal internal carotid diameter as the denominator. COMPARISON:  None FINDINGS: MRI HEAD Brain: Small focus of reduced diffusion is present along the right ventral medulla (series 2, image 10). There is no evidence of intracranial hemorrhage. Prominent left cerebellar developmental 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 anomaly with some associated T2 hyperintensity that may reflect gliosis less likely edema from venous congestion. Patchy and confluent areas of T2 hyperintensity in the supratentorial white matter are nonspecific but may reflect mild to moderate chronic microvascular ischemic changes. Scattered prominent perivascular spaces and/or chronic small vessel infarcts. There is no intracranial mass, mass effect, or edema. There is no hydrocephalus or extra-axial fluid collection. Vascular: Major vessel flow voids at the skull base are preserved. Skull and upper cervical spine: Normal marrow signal is preserved. Sinuses/Orbits: Paranasal sinuses are aerated. Orbits are unremarkable. Other: Sella is unremarkable.  Mastoid air cells are clear. MRA HEAD Intracranial internal carotid arteries are patent with atherosclerotic irregularity. Middle and anterior cerebral arteries are patent. There is diminished flow related enhancement of the intracranial right vertebral artery. On the post-contrast neck  imaging, there is irregularity of the intracranial vertebral artery with high-grade stenosis distally. Intracranial left vertebral arteries, basilar artery, posterior cerebral arteries are patent. Bilateral posterior communicating arteries are present. There is no significant stenosis or aneurysm. MRA NECK Common, internal, and external carotid arteries are patent. No hemodynamically significant stenosis. Retropharyngeal course of the right ICA. Dominant left vertebral artery is patent. Right vertebral artery is patent. 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 Mild to moderate chronic microvascular ischemic changes. 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 Electronically Signed: By: Macy Mis M.D. On: 07/25/2021 19:19   MR ANGIO NECK W WO CONTRAST  Addendum Date: 07/25/2021   ADDENDUM REPORT: 07/25/2021 19:28 ADDENDUM: Contrast dose is 10 mL Gadavist Electronically Signed   By: Macy Mis M.D.   On: 07/25/2021 19:28   Result Date: 07/25/2021 CLINICAL DATA:  Stroke, follow up; neuro deficit, acute, stroke suspected EXAM: MRI HEAD WITHOUT CONTRAST MRA HEAD WITHOUT CONTRAST MRA NECK WITHOUT AND WITH CONTRAST TECHNIQUE: Multiplanar, multi-echo pulse sequences of the brain and surrounding structures were acquired without intravenous contrast. Angiographic images of the Circle of Willis were acquired using MRA technique without intravenous contrast. Angiographic images of the neck were acquired using MRA technique without and with intravenous contrast. Carotid stenosis measurements (when applicable) are obtained utilizing NASCET criteria, using the distal internal carotid diameter as the denominator. COMPARISON:  None FINDINGS: MRI HEAD Brain: Small focus of reduced diffusion is present along the right ventral medulla (series 2, image 10). There is no evidence of intracranial hemorrhage. Prominent left cerebellar developmental 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 anomaly with some associated T2 hyperintensity that may reflect gliosis less likely edema from venous congestion. Patchy and confluent areas of T2 hyperintensity in the supratentorial white matter are nonspecific but may reflect mild to moderate chronic microvascular ischemic changes. Scattered prominent perivascular spaces and/or chronic small vessel infarcts. There is no intracranial mass, mass effect, or edema. There is no hydrocephalus or extra-axial fluid collection. Vascular: Major vessel flow voids at the skull  base are preserved. Skull and upper cervical spine: Normal marrow signal is preserved. Sinuses/Orbits: Paranasal sinuses are aerated. Orbits are unremarkable. Other: Sella is unremarkable.  Mastoid air cells are clear. MRA HEAD Intracranial internal carotid arteries are patent with atherosclerotic irregularity. Middle and anterior cerebral arteries are patent. There is diminished flow related enhancement of the intracranial right vertebral artery. On the post-contrast neck imaging, there is irregularity of the intracranial vertebral artery with high-grade stenosis distally. Intracranial left vertebral arteries, basilar artery, posterior cerebral arteries are patent. Bilateral posterior communicating arteries are present. There is no significant stenosis or aneurysm. MRA NECK Common, internal, and external carotid arteries are patent. No hemodynamically significant stenosis. Retropharyngeal course of the right ICA. Dominant left vertebral artery is patent. Right vertebral artery is patent. 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 Mild to moderate chronic microvascular ischemic changes. 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 Electronically Signed: By: Macy Mis M.D. On: 07/25/2021 19:19   MR BRAIN WO CONTRAST  Addendum Date: 07/25/2021   ADDENDUM REPORT: 07/25/2021 19:28 ADDENDUM: Contrast dose is 10 mL Gadavist Electronically Signed   By: Macy Mis M.D.   On: 07/25/2021 19:28   Result Date: 07/25/2021 CLINICAL DATA:  Stroke, follow up; neuro deficit, acute, stroke suspected EXAM: MRI HEAD WITHOUT CONTRAST MRA HEAD WITHOUT CONTRAST MRA NECK WITHOUT AND WITH CONTRAST TECHNIQUE: Multiplanar, multi-echo  pulse sequences of the brain and surrounding structures were acquired without intravenous contrast. Angiographic images of the Circle of Willis were acquired using MRA technique without intravenous contrast. Angiographic  images of the neck were acquired using MRA technique without and with intravenous contrast. Carotid stenosis measurements (when applicable) are obtained utilizing NASCET criteria, using the distal internal carotid diameter as the denominator. COMPARISON:  None FINDINGS: MRI HEAD Brain: Small focus of reduced diffusion is present along the right ventral medulla (series 2, image 10). There is no evidence of intracranial hemorrhage. Prominent left cerebellar developmental 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 anomaly with some associated T2 hyperintensity that may reflect gliosis less likely edema from venous congestion. Patchy and confluent areas of T2 hyperintensity in the supratentorial white matter are nonspecific but may reflect mild to moderate chronic microvascular ischemic changes. Scattered prominent perivascular spaces and/or chronic small vessel infarcts. There is no intracranial mass, mass effect, or edema. There is no hydrocephalus or extra-axial fluid collection. Vascular: Major vessel flow voids at the skull base are preserved. Skull and upper cervical spine: Normal marrow signal is preserved. Sinuses/Orbits: Paranasal sinuses are aerated. Orbits are unremarkable. Other: Sella is unremarkable.  Mastoid air cells are clear. MRA HEAD Intracranial internal carotid arteries are patent with atherosclerotic irregularity. Middle and anterior cerebral arteries are patent. There is diminished flow related enhancement of the intracranial right vertebral artery. On the post-contrast neck imaging, there is irregularity of the intracranial vertebral artery with high-grade stenosis distally. Intracranial left vertebral arteries, basilar artery, posterior cerebral arteries are patent. Bilateral posterior communicating arteries are present. There is no significant stenosis or aneurysm. MRA NECK Common, internal, and external carotid arteries are patent. No hemodynamically significant stenosis.  Retropharyngeal course of the right ICA. Dominant left vertebral artery is patent. Right vertebral artery is patent. 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 Mild to moderate chronic microvascular ischemic changes. 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 Electronically Signed: By: Macy Mis M.D. On: 07/25/2021 19:19   ECHOCARDIOGRAM COMPLETE  Result Date: 07/26/2021    ECHOCARDIOGRAM REPORT   Patient Name:   Tyler Alvarado Date of Exam: 07/26/2021 Medical Rec #:  [id number]        Height:       67.0 in Accession #:    [id number]       Weight:       217.0 lb Date of Birth:  1946/02/17         BSA:          2.094 m Patient Age:    72 years         BP:           140/81 mmHg Patient Gender: M                HR:           71 bpm. Exam Location:  Inpatient Procedure: 2D Echo Indications:    stroke  History:        Patient has no prior history of Echocardiogram examinations.  Sonographer:    Johny Chess RDCS Referring Phys: Theola Sequin  Sonographer Comments: Image acquisition challenging due to respiratory motion. IMPRESSIONS  1. Left ventricular ejection fraction, by estimation, is 55 to 60%. The left ventricle has normal function. The left ventricle has no regional wall motion abnormalities. There is mild concentric left ventricular hypertrophy. Left ventricular diastolic parameters are consistent with Grade I diastolic dysfunction (  impaired relaxation).  2. Right ventricular systolic function is normal. The right ventricular size is normal. Tricuspid regurgitation signal is inadequate for assessing PA pressure.  3. The mitral valve is normal in structure. No evidence of mitral valve regurgitation. No evidence of mitral stenosis.  4. The aortic valve is tricuspid. Aortic valve regurgitation is not visualized. No aortic stenosis is present.  5. The inferior vena cava is normal in size with greater than 50% respiratory  variability, suggesting right atrial pressure of 3 mmHg. Comparison(s): No prior Echocardiogram. FINDINGS  Left Ventricle: Left ventricular ejection fraction, by estimation, is 55 to 60%. The left ventricle has normal function. The left ventricle has no regional wall motion abnormalities. The left ventricular internal cavity size was normal in size. There is  mild concentric left ventricular hypertrophy. Left ventricular diastolic parameters are consistent with Grade I diastolic dysfunction (impaired relaxation). Right Ventricle: The right ventricular size is normal. No increase in right ventricular wall thickness. Right ventricular systolic function is normal. Tricuspid regurgitation signal is inadequate for assessing PA pressure. Left Atrium: Left atrial size was normal in size. Right Atrium: Right atrial size was normal in size. Pericardium: There is no evidence of pericardial effusion. Mitral Valve: The mitral valve is normal in structure. No evidence of mitral valve regurgitation. No evidence of mitral valve stenosis. Tricuspid Valve: The tricuspid valve is normal in structure. Tricuspid valve regurgitation is not demonstrated. No evidence of tricuspid stenosis. Aortic Valve: The aortic valve is tricuspid. Aortic valve regurgitation is not visualized. No aortic stenosis is present. Pulmonic Valve: The pulmonic valve was grossly normal. Pulmonic valve regurgitation is not visualized. No evidence of pulmonic stenosis. Aorta: The aortic root and ascending aorta are structurally normal, with no evidence of dilitation. Venous: The inferior vena cava is normal in size with greater than 50% respiratory variability, suggesting right atrial pressure of 3 mmHg. IAS/Shunts: No atrial level shunt detected by color flow Doppler.  LEFT VENTRICLE PLAX 2D LVIDd:         4.00 cm   Diastology LVIDs:         2.70 cm   LV e' medial:    8.81 cm/s LV PW:         1.20 cm   LV E/e' medial:  6.5 LV IVS:        1.10 cm   LV e' lateral:    10.00 cm/s LVOT diam:     2.00 cm   LV E/e' lateral: 5.7 LV SV:         61 LV SV Index:   29 LVOT Area:     3.14 cm  RIGHT VENTRICLE             IVC RV S prime:     14.40 cm/s  IVC diam: 1.50 cm TAPSE (M-mode): 1.8 cm LEFT ATRIUM             Index        RIGHT ATRIUM           Index LA diam:        3.60 cm 1.72 cm/m   RA Area:     12.20 cm LA Vol (A2C):   33.4 ml 15.95 ml/m  RA Volume:   27.30 ml  13.04 ml/m LA Vol (A4C):   41.3 ml 19.73 ml/m LA Biplane Vol: 40.3 ml 19.25 ml/m  AORTIC VALVE LVOT Vmax:   99.50 cm/s LVOT Vmean:  65.600 cm/s LVOT VTI:    0.194 m  AORTA Ao Root diam: 2.90 cm Ao Asc diam:  3.00 cm MITRAL VALVE MV Area (PHT): 2.66 cm    SHUNTS MV Decel Time: 285 msec    Systemic VTI:  0.19 m MV E velocity: 57.40 cm/s  Systemic Diam: 2.00 cm MV A velocity: 73.80 cm/s MV E/A ratio:  0.78 Rudean Haskell MD Electronically signed by Rudean Haskell MD Signature Date/Time: 07/26/2021/12:47:17 PM    Final     Pending Labs Unresulted Labs (From admission, onward)     Start     Ordered   08/01/21 0500  Creatinine, serum  (enoxaparin (LOVENOX)    CrCl >/= 30 ml/min)  Weekly,   R     Comments: while on enoxaparin therapy    07/25/21 1741            Vitals/Pain Today's Vitals   07/26/21 0900 07/26/21 1000 07/26/21 1339 07/26/21 1347  BP: 133/80 140/81 (!) 141/85   Pulse: 75 66 88   Resp: 14 16 14    Temp:    97.9 F (36.6 C)  TempSrc:    Oral  SpO2: 97% 94% 95%   PainSc:        Isolation Precautions No active isolations  Medications Medications   stroke: mapping our early stages of recovery book (has no administration in time range)  0.9 %  sodium chloride infusion (has no administration in time range)  acetaminophen (TYLENOL) tablet 650 mg (has no administration in time range)    Or  acetaminophen (TYLENOL) 160 MG/5ML solution 650 mg (has no administration in time range)    Or  acetaminophen (TYLENOL) suppository 650 mg (has no administration in time range)   senna-docusate (Senokot-S) tablet 1 tablet (has no administration in time range)  enoxaparin (LOVENOX) injection 40 mg (has no administration in time range)  clopidogrel (PLAVIX) tablet 75 mg (75 mg Oral Given 07/26/21 1018)  atorvastatin (LIPITOR) tablet 80 mg (80 mg Oral Given 07/26/21 1018)  aspirin EC tablet 81 mg (81 mg Oral Given 07/26/21 1017)  amLODipine (NORVASC) tablet 10 mg (0 mg Oral Hold 07/26/21 1021)  sodium chloride flush (NS) 0.9 % injection 3 mL (3 mLs Intravenous Given 07/25/21 1800)  aspirin tablet 325 mg (325 mg Oral Given 07/25/21 2220)  gadobutrol (GADAVIST) 1 MMOL/ML injection 10 mL (10 mLs Intravenous Contrast Given 07/25/21 1913)    Mobility walks with person assist Moderate fall risk   Focused Assessments Neuro Assessment Handoff:  Swallow screen pass? Yes  Cardiac Rhythm: Normal sinus rhythm NIH Stroke Scale ( + Modified Stroke Scale Criteria)  Interval: Initial Level of Consciousness (1a.)   : Alert, keenly responsive LOC Questions (1b. )   +: Answers both questions correctly LOC Commands (1c. )   + : Performs both tasks correctly Best Gaze (2. )  +: Normal Visual (3. )  +: No visual loss Motor Arm, Left (5a. )   +: No drift Motor Arm, Right (5b. )   +: No drift Motor Leg, Left (6a. )   +: No drift Motor Leg, Right (6b. )   +: No drift Limb Ataxia (7. ): Absent Sensory (8. )   +: Normal, no sensory loss Best Language (9. )   +: No aphasia Extinction/Inattention (11.)   +: No Abnormality Modified SS Total  +: 0     Neuro Assessment: Exceptions to [facility]DL Neuro Checks:   Initial (07/25/21 1554)  Last Documented NIHSS Modified Score: 0 (07/26/21 1115) Has TPA been given? 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 NIH 0.

## 2021-07-26 NOTE — Progress Notes (Signed)
Patient arrived to unit from Emergency Department for inpatient admission. Domestic partner at beside. Oriented to room and placed on cardiac monitor. Admission assessment completed.  Call bell within reach and bed in lowest position.

## 2021-07-26 NOTE — Progress Notes (Signed)
PROGRESS NOTE    Tyler Alvarado  ZOX:[id number] DOB: 07-Feb-1946 DOA: 07/25/2021 PCP: Leilani Able, MD    Chief Complaint  Patient presents with   Dizziness    Brief Narrative:   Tyler Alvarado is a 76 y.o. male with medical history significant of  hypertension, keratoconus, congenital nystagmus, corneal scarring, presents to ED for dizziness started yesterday morning, along with unsteady gait, loss of balance, almost fell,  weakness on the left compared to the right side of the body, along with decreased sensation on the left side. He was found to have Age indeterminate ischemic changes within the bilateral frontal subcortical white matter, left parietal subcortical white matter, and inferior right cerebellar hemisphere.  He was admitted for stroke work up.    Assessment & Plan:   Principal Problem:   Stroke [facility]) Active Problems:   Essential hypertension   Hyperlipidemia   Acute stroke : MRI brain showed Suspected small acute infarct of the right ventral medulla. Mild to moderate chronic microvascular ischemic changes. 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 Neurology consulted , started on Aspirin 81 mg daily, plavix 75 mg daily for 3 months followed by aspirin indefinitely.  LDL is  137 , started him on lipitor.  Hemoglobin A1c is 6.  Therapy evaluations are pending.  Echocardiogram showed LVEF of 55%.  Await further recommendations of neurology.    Hypertension:  Well controlled. On amlodipine at home.  Permissive hypertension for 48 to 72 hours and we will start resuming amlodipine from tomorrow.    Hyperlipidemia:  LDL is 137, started on Lipitor   DVT prophylaxis: (Lovenox) Code Status: (Full code) Family Communication: none at bedside.  Disposition:   Status is: Inpatient  Remains inpatient appropriate because: stroke work up.        Consultants:  Neurology.   Procedures: Echo Left ventricular  ejection fraction, by estimation, is 55 to 60%. The  left ventricle has normal function. The left ventricle has no regional  wall motion abnormalities. There is mild concentric left ventricular  hypertrophy. Left ventricular diastolic  parameters are consistent with Grade I diastolic dysfunction (impaired  relaxation).   Antimicrobials: none.    Subjective: Improving left lower extremity sensation.   Objective: Vitals:   07/26/21 1347 07/26/21 1400 07/26/21 1430 07/26/21 1510  BP:    (!) 151/77  Pulse:  69 69 78  Resp:  12 17 17   Temp: 97.9 F (36.6 C)   98.7 F (37.1 C)  TempSrc: Oral   Oral  SpO2:  94% 99% 100%   No intake or output data in the 24 hours ending 07/26/21 1550 There were no vitals filed for this visit.  Examination:  General exam: Appears calm and comfortable  Respiratory system: Clear to auscultation. Respiratory effort normal. Cardiovascular system: S1 & S2 heard, RRR. No JVD,  No pedal edema. Gastrointestinal system: Abdomen is nondistended, soft and nontender. Normal bowel sounds heard. Central nervous system: Alert and oriented, improving sensation in the left upper extremity, persistent numbness in the lower extremity on the left, . No dizziness on walking today.  Extremities: Symmetric 5 x 5 power. Skin: No rashes, lesions or ulcers Psychiatry:Mood & affect appropriate.     Data Reviewed: I have personally reviewed following labs and imaging studies  CBC: Recent Labs  Lab 07/25/21 1401 07/25/21 1403  WBC 6.1  --   NEUTROABS 3.5  --   HGB 13.9 15.6  HCT 43.3 46.0  MCV 92.3  --  PLT 248  --     Basic Metabolic Panel: Recent Labs  Lab 07/25/21 1401 07/25/21 1403  NA 132* 139  K 3.6 3.7  CL 104 102  CO2 24  --   GLUCOSE 97 97  BUN 14 15  CREATININE 1.00 0.90  CALCIUM 8.5*  --     GFR: CrCl cannot be calculated (Unknown ideal weight.).  Liver Function Tests: Recent Labs  Lab 07/25/21 1401  AST 17  ALT 21  ALKPHOS 78   BILITOT 0.7  PROT 8.5*  ALBUMIN 3.6    CBG: No results for input(s): GLUCAP in the last 168 hours.   Recent Results (from the past 240 hour(s))  Resp Panel by RT-PCR (Flu A&B, Covid) Nasopharyngeal Swab     Status: None   Collection Time: 07/25/21  6:21 PM   Specimen: Nasopharyngeal Swab; Nasopharyngeal(NP) swabs in vial transport medium  Result Value Ref Range Status   SARS Coronavirus 2 by RT PCR NEGATIVE NEGATIVE Final    Comment: (NOTE) SARS-CoV-2 target nucleic acids are NOT DETECTED.  The SARS-CoV-2 RNA is generally detectable in upper respiratory specimens during the acute phase of infection. The lowest concentration of SARS-CoV-2 viral copies this assay can detect is 138 copies/mL. A negative result does not preclude SARS-Cov-2 infection and should not be used as the sole basis for treatment or other patient management decisions. A negative result may occur with  improper specimen collection/handling, submission of specimen other than nasopharyngeal swab, presence of viral mutation(s) within the areas targeted by this assay, and inadequate number of viral copies(<138 copies/mL). A negative result must be combined with clinical observations, patient history, and epidemiological information. The expected result is Negative.  Fact Sheet for Patients:  BloggerCourse.comhttps://www.fda.gov/media/152166/download  Fact Sheet for Healthcare Providers:  SeriousBroker.ithttps://www.fda.gov/media/152162/download  This test is no t yet approved or cleared by the [place] FDA and  has been authorized for detection and/or diagnosis of SARS-CoV-2 by FDA under an Emergency Use Authorization (EUA). This EUA will remain  in effect (meaning this test can be used) for the duration of the COVID-19 declaration under Section 564(b)(1) of the Act, 21 U.S.C.section 360bbb-3(b)(1), unless the authorization is terminated  or revoked sooner.       Influenza A by PCR NEGATIVE NEGATIVE Final   Influenza B by PCR  NEGATIVE NEGATIVE Final    Comment: (NOTE) The Xpert Xpress SARS-CoV-2/FLU/RSV plus assay is intended as an aid in the diagnosis of influenza from Nasopharyngeal swab specimens and should not be used as a sole basis for treatment. Nasal washings and aspirates are unacceptable for Xpert Xpress SARS-CoV-2/FLU/RSV testing.  Fact Sheet for Patients: BloggerCourse.comhttps://www.fda.gov/media/152166/download  Fact Sheet for Healthcare Providers: SeriousBroker.ithttps://www.fda.gov/media/152162/download  This test is not yet approved or cleared by the [place] FDA and has been authorized for detection and/or diagnosis of SARS-CoV-2 by FDA under an Emergency Use Authorization (EUA). This EUA will remain in effect (meaning this test can be used) for the duration of the COVID-19 declaration under Section 564(b)(1) of the Act, 21 U.S.C. section 360bbb-3(b)(1), unless the authorization is terminated or revoked.  Performed at [facility], [address] [place], [place] [id number]          Radiology Studies: CT HEAD WO CONTRAST (5MM)  Result Date: 07/25/2021 CLINICAL DATA:  Dizziness and left-sided weakness since 5 a.m. yesterday EXAM: CT HEAD WITHOUT CONTRAST TECHNIQUE: Contiguous axial images were obtained from the base of the skull through the vertex without intravenous contrast. RADIATION DOSE REDUCTION:  This exam was performed according to the departmental dose-optimization program which includes automated exposure control, adjustment of the mA and/or kV according to patient size and/or use of iterative reconstruction technique. COMPARISON:  None. FINDINGS: Brain: Subcortical white matter hypodensities are seen within the bilateral frontal regions and left parietal region, consistent with age-indeterminate small vessel ischemic change. Focal hypodensity inferior right cerebellar hemisphere consistent with age indeterminate cerebellar infarct. No other signs of acute infarct or hemorrhage. Lateral ventricles  and midline structures are unremarkable. No acute extra-axial fluid collections. No mass effect. Vascular: No hyperdense vessel or unexpected calcification. Skull: Normal. Negative for fracture or focal lesion. Sinuses/Orbits: Postsurgical changes left globe, with scleral buckle and metallic clip identified. Paranasal sinuses are unremarkable. Other: None. IMPRESSION: 1. Age indeterminate ischemic changes within the bilateral frontal subcortical white matter, left parietal subcortical white matter, and inferior right cerebellar hemisphere. 2. No evidence of acute hemorrhage. 3. Postsurgical changes left globe. Electronically Signed   By: Sharlet SalinaMichael  Brown M.D.   On: 07/25/2021 15:31   MR ANGIO HEAD WO CONTRAST  Addendum Date: 07/25/2021   ADDENDUM REPORT: 07/25/2021 19:28 ADDENDUM: Contrast dose is 10 mL Gadavist Electronically Signed   By: Guadlupe SpanishPraneil  Patel M.D.   On: 07/25/2021 19:28   Result Date: 07/25/2021 CLINICAL DATA:  Stroke, follow up; neuro deficit, acute, stroke suspected EXAM: MRI HEAD WITHOUT CONTRAST MRA HEAD WITHOUT CONTRAST MRA NECK WITHOUT AND WITH CONTRAST TECHNIQUE: Multiplanar, multi-echo pulse sequences of the brain and surrounding structures were acquired without intravenous contrast. Angiographic images of the Circle of Willis were acquired using MRA technique without intravenous contrast. Angiographic images of the neck were acquired using MRA technique without and with intravenous contrast. Carotid stenosis measurements (when applicable) are obtained utilizing NASCET criteria, using the distal internal carotid diameter as the denominator. COMPARISON:  None FINDINGS: MRI HEAD Brain: Small focus of reduced diffusion is present along the right ventral medulla (series 2, image 10). There is no evidence of intracranial hemorrhage. Prominent left cerebellar developmental 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 anomaly with some associated T2 hyperintensity that may reflect  gliosis less likely edema from venous congestion. Patchy and confluent areas of T2 hyperintensity in the supratentorial white matter are nonspecific but may reflect mild to moderate chronic microvascular ischemic changes. Scattered prominent perivascular spaces and/or chronic small vessel infarcts. There is no intracranial mass, mass effect, or edema. There is no hydrocephalus or extra-axial fluid collection. Vascular: Major vessel flow voids at the skull base are preserved. Skull and upper cervical spine: Normal marrow signal is preserved. Sinuses/Orbits: Paranasal sinuses are aerated. Orbits are unremarkable. Other: Sella is unremarkable.  Mastoid air cells are clear. MRA HEAD Intracranial internal carotid arteries are patent with atherosclerotic irregularity. Middle and anterior cerebral arteries are patent. There is diminished flow related enhancement of the intracranial right vertebral artery. On the post-contrast neck imaging, there is irregularity of the intracranial vertebral artery with high-grade stenosis distally. Intracranial left vertebral arteries, basilar artery, posterior cerebral arteries are patent. Bilateral posterior communicating arteries are present. There is no significant stenosis or aneurysm. MRA NECK Common, internal, and external carotid arteries are patent. No hemodynamically significant stenosis. Retropharyngeal course of the right ICA. Dominant left vertebral artery is patent. Right vertebral artery is patent. 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 Mild to moderate chronic microvascular ischemic changes. 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 Electronically Signed: By: Guadlupe SpanishPraneil  Patel M.D. On: 07/25/2021 19:19   MR ANGIO  NECK W WO CONTRAST  Addendum Date: 07/25/2021   ADDENDUM REPORT: 07/25/2021 19:28 ADDENDUM: Contrast dose is 10 mL Gadavist Electronically Signed   By: Guadlupe Spanish M.D.   On:  07/25/2021 19:28   Result Date: 07/25/2021 CLINICAL DATA:  Stroke, follow up; neuro deficit, acute, stroke suspected EXAM: MRI HEAD WITHOUT CONTRAST MRA HEAD WITHOUT CONTRAST MRA NECK WITHOUT AND WITH CONTRAST TECHNIQUE: Multiplanar, multi-echo pulse sequences of the brain and surrounding structures were acquired without intravenous contrast. Angiographic images of the Circle of Willis were acquired using MRA technique without intravenous contrast. Angiographic images of the neck were acquired using MRA technique without and with intravenous contrast. Carotid stenosis measurements (when applicable) are obtained utilizing NASCET criteria, using the distal internal carotid diameter as the denominator. COMPARISON:  None FINDINGS: MRI HEAD Brain: Small focus of reduced diffusion is present along the right ventral medulla (series 2, image 10). There is no evidence of intracranial hemorrhage. Prominent left cerebellar developmental 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 anomaly with some associated T2 hyperintensity that may reflect gliosis less likely edema from venous congestion. Patchy and confluent areas of T2 hyperintensity in the supratentorial white matter are nonspecific but may reflect mild to moderate chronic microvascular ischemic changes. Scattered prominent perivascular spaces and/or chronic small vessel infarcts. There is no intracranial mass, mass effect, or edema. There is no hydrocephalus or extra-axial fluid collection. Vascular: Major vessel flow voids at the skull base are preserved. Skull and upper cervical spine: Normal marrow signal is preserved. Sinuses/Orbits: Paranasal sinuses are aerated. Orbits are unremarkable. Other: Sella is unremarkable.  Mastoid air cells are clear. MRA HEAD Intracranial internal carotid arteries are patent with atherosclerotic irregularity. Middle and anterior cerebral arteries are patent. There is diminished flow related enhancement of the  intracranial right vertebral artery. On the post-contrast neck imaging, there is irregularity of the intracranial vertebral artery with high-grade stenosis distally. Intracranial left vertebral arteries, basilar artery, posterior cerebral arteries are patent. Bilateral posterior communicating arteries are present. There is no significant stenosis or aneurysm. MRA NECK Common, internal, and external carotid arteries are patent. No hemodynamically significant stenosis. Retropharyngeal course of the right ICA. Dominant left vertebral artery is patent. Right vertebral artery is patent. 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 Mild to moderate chronic microvascular ischemic changes. 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 Electronically Signed: By: Guadlupe Spanish M.D. On: 07/25/2021 19:19   MR BRAIN WO CONTRAST  Addendum Date: 07/25/2021   ADDENDUM REPORT: 07/25/2021 19:28 ADDENDUM: Contrast dose is 10 mL Gadavist Electronically Signed   By: Guadlupe Spanish M.D.   On: 07/25/2021 19:28   Result Date: 07/25/2021 CLINICAL DATA:  Stroke, follow up; neuro deficit, acute, stroke suspected EXAM: MRI HEAD WITHOUT CONTRAST MRA HEAD WITHOUT CONTRAST MRA NECK WITHOUT AND WITH CONTRAST TECHNIQUE: Multiplanar, multi-echo pulse sequences of the brain and surrounding structures were acquired without intravenous contrast. Angiographic images of the Circle of Willis were acquired using MRA technique without intravenous contrast. Angiographic images of the neck were acquired using MRA technique without and with intravenous contrast. Carotid stenosis measurements (when applicable) are obtained utilizing NASCET criteria, using the distal internal carotid diameter as the denominator. COMPARISON:  None FINDINGS: MRI HEAD Brain: Small focus of reduced diffusion is present along the right ventral medulla (series 2, image 10). There is no evidence of  intracranial hemorrhage. Prominent left cerebellar developmental 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 anomaly with some associated T2 hyperintensity  that may reflect gliosis less likely edema from venous congestion. Patchy and confluent areas of T2 hyperintensity in the supratentorial white matter are nonspecific but may reflect mild to moderate chronic microvascular ischemic changes. Scattered prominent perivascular spaces and/or chronic small vessel infarcts. There is no intracranial mass, mass effect, or edema. There is no hydrocephalus or extra-axial fluid collection. Vascular: Major vessel flow voids at the skull base are preserved. Skull and upper cervical spine: Normal marrow signal is preserved. Sinuses/Orbits: Paranasal sinuses are aerated. Orbits are unremarkable. Other: Sella is unremarkable.  Mastoid air cells are clear. MRA HEAD Intracranial internal carotid arteries are patent with atherosclerotic irregularity. Middle and anterior cerebral arteries are patent. There is diminished flow related enhancement of the intracranial right vertebral artery. On the post-contrast neck imaging, there is irregularity of the intracranial vertebral artery with high-grade stenosis distally. Intracranial left vertebral arteries, basilar artery, posterior cerebral arteries are patent. Bilateral posterior communicating arteries are present. There is no significant stenosis or aneurysm. MRA NECK Common, internal, and external carotid arteries are patent. No hemodynamically significant stenosis. Retropharyngeal course of the right ICA. Dominant left vertebral artery is patent. Right vertebral artery is patent. 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 Mild to moderate chronic microvascular ischemic changes. 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 Electronically Signed: By: Guadlupe Spanish M.D. On: 07/25/2021 19:19    ECHOCARDIOGRAM COMPLETE  Result Date: 07/26/2021    ECHOCARDIOGRAM REPORT   Patient Name:   BURLEIGH BROCKMANN Date of Exam: 07/26/2021 Medical Rec #:  [id number]        Height:       67.0 in Accession #:    [id number]       Weight:       217.0 lb Date of Birth:  [birth year]         BSA:          2.094 m Patient Age:    75 years         BP:           140/81 mmHg Patient Gender: M                HR:           71 bpm. Exam Location:  Inpatient Procedure: 2D Echo Indications:    stroke  History:        Patient has no prior history of Echocardiogram examinations.  Sonographer:    Delcie Roch RDCS Referring Phys: Herminio Heads  Sonographer Comments: Image acquisition challenging due to respiratory motion. IMPRESSIONS  1. Left ventricular ejection fraction, by estimation, is 55 to 60%. The left ventricle has normal function. The left ventricle has no regional wall motion abnormalities. There is mild concentric left ventricular hypertrophy. Left ventricular diastolic parameters are consistent with Grade I diastolic dysfunction (impaired relaxation).  2. Right ventricular systolic function is normal. The right ventricular size is normal. Tricuspid regurgitation signal is inadequate for assessing PA pressure.  3. The mitral valve is normal in structure. No evidence of mitral valve regurgitation. No evidence of mitral stenosis.  4. The aortic valve is tricuspid. Aortic valve regurgitation is not visualized. No aortic stenosis is present.  5. The inferior vena cava is normal in size with greater than 50% respiratory variability, suggesting right atrial pressure of 3 mmHg. Comparison(s): No prior Echocardiogram. FINDINGS  Left Ventricle: Left ventricular ejection fraction, by estimation, is 55 to 60%. The left ventricle has normal function.  The left ventricle has no regional wall motion abnormalities. The left ventricular internal cavity size was normal in size. There is  mild concentric left ventricular hypertrophy.  Left ventricular diastolic parameters are consistent with Grade I diastolic dysfunction (impaired relaxation). Right Ventricle: The right ventricular size is normal. No increase in right ventricular wall thickness. Right ventricular systolic function is normal. Tricuspid regurgitation signal is inadequate for assessing PA pressure. Left Atrium: Left atrial size was normal in size. Right Atrium: Right atrial size was normal in size. Pericardium: There is no evidence of pericardial effusion. Mitral Valve: The mitral valve is normal in structure. No evidence of mitral valve regurgitation. No evidence of mitral valve stenosis. Tricuspid Valve: The tricuspid valve is normal in structure. Tricuspid valve regurgitation is not demonstrated. No evidence of tricuspid stenosis. Aortic Valve: The aortic valve is tricuspid. Aortic valve regurgitation is not visualized. No aortic stenosis is present. Pulmonic Valve: The pulmonic valve was grossly normal. Pulmonic valve regurgitation is not visualized. No evidence of pulmonic stenosis. Aorta: The aortic root and ascending aorta are structurally normal, with no evidence of dilitation. Venous: The inferior vena cava is normal in size with greater than 50% respiratory variability, suggesting right atrial pressure of 3 mmHg. IAS/Shunts: No atrial level shunt detected by color flow Doppler.  LEFT VENTRICLE PLAX 2D LVIDd:         4.00 cm   Diastology LVIDs:         2.70 cm   LV e' medial:    8.81 cm/s LV PW:         1.20 cm   LV E/e' medial:  6.5 LV IVS:        1.10 cm   LV e' lateral:   10.00 cm/s LVOT diam:     2.00 cm   LV E/e' lateral: 5.7 LV SV:         61 LV SV Index:   29 LVOT Area:     3.14 cm  RIGHT VENTRICLE             IVC RV S prime:     14.40 cm/s  IVC diam: 1.50 cm TAPSE (M-mode): 1.8 cm LEFT ATRIUM             Index        RIGHT ATRIUM           Index LA diam:        3.60 cm 1.72 cm/m   RA Area:     12.20 cm LA Vol (A2C):   33.4 ml 15.95 ml/m  RA Volume:   27.30 ml   13.04 ml/m LA Vol (A4C):   41.3 ml 19.73 ml/m LA Biplane Vol: 40.3 ml 19.25 ml/m  AORTIC VALVE LVOT Vmax:   99.50 cm/s LVOT Vmean:  65.600 cm/s LVOT VTI:    0.194 m  AORTA Ao Root diam: 2.90 cm Ao Asc diam:  3.00 cm MITRAL VALVE MV Area (PHT): 2.66 cm    SHUNTS MV Decel Time: 285 msec    Systemic VTI:  0.19 m MV E velocity: 57.40 cm/s  Systemic Diam: 2.00 cm MV A velocity: 73.80 cm/s MV E/A ratio:  0.78 Riley Lam MD Electronically signed by Riley Lam MD Signature Date/Time: 07/26/2021/12:47:17 PM    Final         Scheduled Meds:   stroke: mapping our early stages of recovery book   Does not apply Once   amLODipine  10 mg Oral Daily   aspirin  EC  81 mg Oral Daily   atorvastatin  80 mg Oral Daily   clopidogrel  75 mg Oral Daily   enoxaparin (LOVENOX) injection  40 mg Subcutaneous Q24H   Continuous Infusions:  sodium chloride       LOS: 1 day        Kathlen Mody, MD [facility]   To contact the attending provider between 7A-7P or the covering provider during after hours 7P-7A, please log into the web site [email or web address] and access using universal [place] password for that web site. If you do not have the password, please call the hospital operator.  07/26/2021, 3:50 PM

## 2021-07-26 NOTE — ED Notes (Signed)
Pt transported to echo 

## 2021-07-26 NOTE — Progress Notes (Signed)
°  Echocardiogram 2D Echocardiogram has been performed.  Delcie Roch 07/26/2021, 11:08 AM

## 2021-07-26 NOTE — ED Notes (Signed)
Notified providers that pt amlodipine has not been ordered and pt is requesting his amlodipine. Awaiting response.

## 2021-07-26 NOTE — Progress Notes (Addendum)
STROKE TEAM PROGRESS NOTE   INTERVAL HISTORY His partner is at the bedside.  Patient reports to having dizziness, dysphagia, and unsteady gait on 07/24/21. Patient also began noting dysarthria which prompted him to be taken to the ED. Patient reports that symptoms have since steadily improved. 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 MRI scan of the brain shows tiny punctate ventral right medullary infarct.  MR angiogram shows diminished flow versus chronic occlusion of the right vertebral artery. Discussed plan to have patient on ASA and Plavix for 3 months then ASA indefinitely. Patient agreeable to plan and all questions were addressed at this time.  Patient admits to snoring and likely has sleep apnea.  He is willing to consider participation in the sleep smart stroke prevention study for sleep apnea. Vitals:   07/25/21 2149 07/25/21 2318 07/26/21 0140 07/26/21 0357  BP: (!) 167/92 135/75 101/62 134/64  Pulse: 82 64 79 80  Resp: 17 16 16 18   Temp:      SpO2: 98% 96%  99%   CBC:  Recent Labs  Lab 07/25/21 1401 07/25/21 1403  WBC 6.1  --   NEUTROABS 3.5  --   HGB 13.9 15.6  HCT 43.3 46.0  MCV 92.3  --   PLT 248  --    Basic Metabolic Panel:  Recent Labs  Lab 07/25/21 1401 07/25/21 1403  NA 132* 139  K 3.6 3.7  CL 104 102  CO2 24  --   GLUCOSE 97 97  BUN 14 15  CREATININE 1.00 0.90  CALCIUM 8.5*  --    Lipid Panel:  Recent Labs  Lab 07/26/21 0324  CHOL 190  TRIG 51  HDL 43  CHOLHDL 4.4  VLDL 10  LDLCALC 137*   HgbA1c:  Recent Labs  Lab 07/26/21 0324  HGBA1C 6.0*   Urine Drug Screen: No results for input(s): LABOPIA, COCAINSCRNUR, LABBENZ, AMPHETMU, THCU, LABBARB in the last 168 hours.  Alcohol Level No results for input(s): ETH in the last 168 hours.  IMAGING past 24 hours CT HEAD WO CONTRAST  (5MM)  Result Date: 07/25/2021 CLINICAL DATA:  Dizziness and left-sided weakness since 5 a.m. yesterday EXAM: CT HEAD WITHOUT CONTRAST TECHNIQUE: Contiguous axial images were obtained from the base of the skull through the vertex without intravenous contrast. RADIATION DOSE REDUCTION: This exam was performed according to the departmental dose-optimization program which includes automated exposure control, adjustment of the mA and/or kV according to patient size and/or use of iterative reconstruction technique. COMPARISON:  None. FINDINGS: Brain: Subcortical white matter hypodensities are seen within the bilateral frontal regions and left parietal region, consistent with age-indeterminate small vessel ischemic change. Focal hypodensity inferior right cerebellar hemisphere consistent with age indeterminate cerebellar infarct. No other signs of acute infarct or hemorrhage. Lateral ventricles and midline structures are unremarkable. No acute extra-axial fluid collections. No mass effect. Vascular: No hyperdense vessel or unexpected calcification. Skull: Normal. Negative for fracture or focal lesion. Sinuses/Orbits: Postsurgical changes left globe, with scleral buckle and metallic clip identified. Paranasal sinuses are unremarkable. Other: None. IMPRESSION: 1. Age indeterminate ischemic changes within the bilateral frontal subcortical white matter, left parietal subcortical white matter, and inferior right cerebellar hemisphere. 2. No evidence of acute hemorrhage. 3. Postsurgical changes left globe. Electronically Signed   By: Randa Ngo M.D.   On: 07/25/2021 15:31   MR  ANGIO HEAD WO CONTRAST  Addendum Date: 07/25/2021   ADDENDUM REPORT: 07/25/2021 19:28 ADDENDUM: Contrast dose is 10 mL Gadavist Electronically Signed   By: Macy Mis M.D.   On: 07/25/2021 19:28   Result Date: 07/25/2021 CLINICAL DATA:  Stroke, follow up; neuro deficit, acute, stroke suspected EXAM: MRI HEAD WITHOUT CONTRAST MRA HEAD  WITHOUT CONTRAST MRA NECK WITHOUT AND WITH CONTRAST TECHNIQUE: Multiplanar, multi-echo pulse sequences of the brain and surrounding structures were acquired without intravenous contrast. Angiographic images of the Circle of Willis were acquired using MRA technique without intravenous contrast. Angiographic images of the neck were acquired using MRA technique without and with intravenous contrast. Carotid stenosis measurements (when applicable) are obtained utilizing NASCET criteria, using the distal internal carotid diameter as the denominator. COMPARISON:  None FINDINGS: MRI HEAD Brain: Small focus of reduced diffusion is present along the right ventral medulla (series 2, image 10). There is no evidence of intracranial hemorrhage. Prominent left cerebellar developmental 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 anomaly with some associated T2 hyperintensity that may reflect gliosis less likely edema from venous congestion. Patchy and confluent areas of T2 hyperintensity in the supratentorial white matter are nonspecific but may reflect mild to moderate chronic microvascular ischemic changes. Scattered prominent perivascular spaces and/or chronic small vessel infarcts. There is no intracranial mass, mass effect, or edema. There is no hydrocephalus or extra-axial fluid collection. Vascular: Major vessel flow voids at the skull base are preserved. Skull and upper cervical spine: Normal marrow signal is preserved. Sinuses/Orbits: Paranasal sinuses are aerated. Orbits are unremarkable. Other: Sella is unremarkable.  Mastoid air cells are clear. MRA HEAD Intracranial internal carotid arteries are patent with atherosclerotic irregularity. Middle and anterior cerebral arteries are patent. There is diminished flow related enhancement of the intracranial right vertebral artery. On the post-contrast neck imaging, there is irregularity of the intracranial vertebral artery with high-grade stenosis distally.  Intracranial left vertebral arteries, basilar artery, posterior cerebral arteries are patent. Bilateral posterior communicating arteries are present. There is no significant stenosis or aneurysm. MRA NECK Common, internal, and external carotid arteries are patent. No hemodynamically significant stenosis. Retropharyngeal course of the right ICA. Dominant left vertebral artery is patent. Right vertebral artery is patent. 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 Mild to moderate chronic microvascular ischemic changes. 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 Electronically Signed: By: Macy Mis M.D. On: 07/25/2021 19:19   MR ANGIO NECK W WO CONTRAST  Addendum Date: 07/25/2021   ADDENDUM REPORT: 07/25/2021 19:28 ADDENDUM: Contrast dose is 10 mL Gadavist Electronically Signed   By: Macy Mis M.D.   On: 07/25/2021 19:28   Result Date: 07/25/2021 CLINICAL DATA:  Stroke, follow up; neuro deficit, acute, stroke suspected EXAM: MRI HEAD WITHOUT CONTRAST MRA HEAD WITHOUT CONTRAST MRA NECK WITHOUT AND WITH CONTRAST TECHNIQUE: Multiplanar, multi-echo pulse sequences of the brain and surrounding structures were acquired without intravenous contrast. Angiographic images of the Circle of Willis were acquired using MRA technique without intravenous contrast. Angiographic images of the neck were acquired using MRA technique without and with intravenous contrast. Carotid stenosis measurements (when applicable) are obtained utilizing NASCET criteria, using the distal internal carotid diameter as the denominator. COMPARISON:  None FINDINGS: MRI HEAD Brain: Small focus of reduced diffusion is present along the right ventral medulla (series 2, image 10). There is no evidence of intracranial hemorrhage. Prominent left cerebellar developmental 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 anomaly with some associated T2  hyperintensity that may reflect gliosis less likely edema from venous congestion. Patchy and confluent areas of T2 hyperintensity in the supratentorial white matter are nonspecific but may reflect mild to moderate chronic microvascular ischemic changes. Scattered prominent perivascular spaces and/or chronic small vessel infarcts. There is no intracranial mass, mass effect, or edema. There is no hydrocephalus or extra-axial fluid collection. Vascular: Major vessel flow voids at the skull base are preserved. Skull and upper cervical spine: Normal marrow signal is preserved. Sinuses/Orbits: Paranasal sinuses are aerated. Orbits are unremarkable. Other: Sella is unremarkable.  Mastoid air cells are clear. MRA HEAD Intracranial internal carotid arteries are patent with atherosclerotic irregularity. Middle and anterior cerebral arteries are patent. There is diminished flow related enhancement of the intracranial right vertebral artery. On the post-contrast neck imaging, there is irregularity of the intracranial vertebral artery with high-grade stenosis distally. Intracranial left vertebral arteries, basilar artery, posterior cerebral arteries are patent. Bilateral posterior communicating arteries are present. There is no significant stenosis or aneurysm. MRA NECK Common, internal, and external carotid arteries are patent. No hemodynamically significant stenosis. Retropharyngeal course of the right ICA. Dominant left vertebral artery is patent. Right vertebral artery is patent. 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 Mild to moderate chronic microvascular ischemic changes. 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 Electronically Signed: By: Guadlupe Spanish M.D. On: 07/25/2021 19:19   MR BRAIN WO CONTRAST  Addendum Date: 07/25/2021   ADDENDUM REPORT: 07/25/2021 19:28 ADDENDUM: Contrast dose is 10 mL Gadavist Electronically Signed   By:  Guadlupe Spanish M.D.   On: 07/25/2021 19:28   Result Date: 07/25/2021 CLINICAL DATA:  Stroke, follow up; neuro deficit, acute, stroke suspected EXAM: MRI HEAD WITHOUT CONTRAST MRA HEAD WITHOUT CONTRAST MRA NECK WITHOUT AND WITH CONTRAST TECHNIQUE: Multiplanar, multi-echo pulse sequences of the brain and surrounding structures were acquired without intravenous contrast. Angiographic images of the Circle of Willis were acquired using MRA technique without intravenous contrast. Angiographic images of the neck were acquired using MRA technique without and with intravenous contrast. Carotid stenosis measurements (when applicable) are obtained utilizing NASCET criteria, using the distal internal carotid diameter as the denominator. COMPARISON:  None FINDINGS: MRI HEAD Brain: Small focus of reduced diffusion is present along the right ventral medulla (series 2, image 10). There is no evidence of intracranial hemorrhage. Prominent left cerebellar developmental 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 anomaly with some associated T2 hyperintensity that may reflect gliosis less likely edema from venous congestion. Patchy and confluent areas of T2 hyperintensity in the supratentorial white matter are nonspecific but may reflect mild to moderate chronic microvascular ischemic changes. Scattered prominent perivascular spaces and/or chronic small vessel infarcts. There is no intracranial mass, mass effect, or edema. There is no hydrocephalus or extra-axial fluid collection. Vascular: Major vessel flow voids at the skull base are preserved. Skull and upper cervical spine: Normal marrow signal is preserved. Sinuses/Orbits: Paranasal sinuses are aerated. Orbits are unremarkable. Other: Sella is unremarkable.  Mastoid air cells are clear. MRA HEAD Intracranial internal carotid arteries are patent with atherosclerotic irregularity. Middle and anterior cerebral arteries are patent. There is diminished flow related  enhancement of the intracranial right vertebral artery. On the post-contrast neck imaging, there is irregularity of the intracranial vertebral artery with high-grade stenosis distally. Intracranial left vertebral arteries, basilar artery, posterior cerebral arteries are patent. Bilateral posterior communicating arteries are present. There is no significant stenosis or aneurysm. MRA NECK Common, internal, and external carotid arteries  are patent. No hemodynamically significant stenosis. Retropharyngeal course of the right ICA. Dominant left vertebral artery is patent. Right vertebral artery is patent. 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 Mild to moderate chronic microvascular ischemic changes. 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 Electronically Signed: By: Macy Mis M.D. On: 07/25/2021 19:19    PHYSICAL EXAM  Temp:  [97.8 F (36.6 C)] 97.8 F (36.6 C) (01/14 1318) Pulse Rate:  [64-91] 80 (01/15 0357) Resp:  [14-22] 18 (01/15 0357) BP: (101-179)/(62-113) 134/64 (01/15 0357) SpO2:  [93 %-99 %] 99 % (01/15 0357)  General -mildly obese elderly African-American male.  In no apparent distress.  Cardiovascular - Regular rhythm and rate.  Mental Status -  Level of arousal and orientation to time, place, and person were intact. Language including expression, naming, repetition, comprehension was assessed and found intact. Attention span and concentration were normal. Recent and remote memory were intact. Fund of Knowledge was assessed and was intact.  Corneal opacity in left pupil. Right pupil round and reactive to light. Rhythmic pendular nystagmus bilaterally in all directions of gaze. Facial sensation intact bilaterally, face symmetric, tongue, and palate midline.   Motor Strength - The patients strength was normal in all extremities and pronator drift was absent.  Bulk was normal and fasciculations were  absent.   Motor Tone - Muscle tone was assessed at the neck and appendages and was normal.  Reflexes - Weak cough reflex. No pathological reflexes otherwise noted.   Sensory - Light touch, temperature/pinprick were assessed and were symmetrical.    Coordination - The patient had normal movements in the hands and feet with no ataxia or dysmetria.  Tremor was absent.  Gait and Station - deferred.  NIH stroke scale 2.    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 KAILOR HOLSONBACK is a 75 y.o. male with history of HTN, congenital nystagmus, corneal scarring and keratoconus presenting with unsteady gait, loss of balance, left sided weakness and left sided decrease in sensation.   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 No acute hemorrhage.   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 Mild to moderate chronic microvascular ischemic changes. 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 Yes; Fluid consistency: Thin   No antithrombotic prior to admission, now on aspirin 81 mg daily and clopidogrel 75 mg daily. Recommend ASA+Plavix for 3 months followed by ASA indefinitely Therapy recommendations:  pending Disposition:  pending  Hypertension Home meds: Norvasc 10 mg Stable Permissive hypertension (OK if < 220/120) but gradually normalize in 5-7 days Long-term BP goal normotensive  Hyperlipidemia Home meds:  none LDL 137, goal < 70 Started on lipitor 80 this admission Continue statin at discharge  Other Stroke Risk Factors Advanced Age  >/= 47  Obesity,  BMI >/= 30 associated with increased stroke risk, recommend weight loss, diet and exercise as appropriate  Obstructive sleep apnea, not on CPAP at home   Hospital day # 1  France Ravens, MD PGY1 Resdient  STROKE MD NOTE :  I have personally obtained history,examined this patient, reviewed notes, independently viewed  imaging studies, participated in medical decision making and plan of care.ROS completed by me personally and pertinent positives fully documented  I have made any additions or clarifications directly to the above note. Agree with note above.  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  Recommend dual antiplatelet therapy of aspirin and Plavix for 3 months followed by aspirin alone.  Aggressive risk factor modifications.  Start statin.  Patient also appears to be at risk for sleep apnea and may benefit with consideration for possible participation in the sleep smart study.  He was given information to review and decide.  Discussed with patient, his significant other and Dr. Karleen Hampshire.  Greater than 50% time during this 50-minute visit was spent in counseling and coordination of care discussion about stroke, stroke prevention and sleep apnea and answering questions.  Antony Contras, MD Medical Director [facility] Stroke Center Pager: [phone] 07/26/2021 3:58 PM   To contact Stroke Continuity provider, please refer to [email or web address] After hours, contact General Neurology

## 2021-07-27 MED ORDER — ATORVASTATIN CALCIUM 80 MG PO TABS: 80.0000 mg | ORAL_TABLET | Freq: Every day | ORAL | 2 refills | Status: DC

## 2021-07-27 MED ORDER — CLOPIDOGREL BISULFATE 75 MG PO TABS: 75.0000 mg | ORAL_TABLET | Freq: Every day | ORAL | 2 refills | Status: DC

## 2021-07-27 MED ORDER — ASPIRIN 81 MG PO TBEC: 81.0000 mg | DELAYED_RELEASE_TABLET | Freq: Every day | ORAL | 11 refills | Status: AC

## 2021-07-27 NOTE — TOC CAGE-AID Note (Signed)
Transition of Care [facility]) - CAGE-AID Screening   Patient Details  Name: Tyler Alvarado MRN: [id number] Date of Birth: [birth year]  Transition of Care [facility]) CM/SW Contact:    Erin Sons, LCSW Phone Number: 07/27/2021, 10:57 AM   Clinical Narrative:  CAGE-AID Completed; score of 0.   CAGE-AID Screening:    Have You Ever Felt You Ought to Cut Down on Your Drinking or Drug Use?: No Have People Annoyed You By Critizing Your Drinking Or Drug Use?: No Have You Felt Bad Or Guilty About Your Drinking Or Drug Use?: No Have You Ever Had a Drink or Used Drugs First Thing In The Morning to Steady Your Nerves or to Get Rid of a Hangover?: No CAGE-AID Score: 0  Substance Abuse Education Offered: No

## 2021-07-27 NOTE — TOC Initial Note (Signed)
Transition of Care [facility]) - Initial/Assessment Note    Patient Details  Name: Tyler Alvarado MRN: [id number] Date of Birth: June 09, 1946  [facility] [place]) CM/SW Contact:    Kermit Balo, RN Phone Number: 07/27/2021, 11:55 AM  Clinical Narrative:                 Patient is from home with his significant other: [name]. She is Alvarado to provide the supervision pt needs per Pt. No DME at home. Pt is responsible for his own medications (was only taking BP med). Samson Frederic may need to over see initially once home.  [facility] [address] for his medications.  Family provides needed transportation.  Recommendations for [facility] services. CM will have this arranged prior to d/c home.  Also with recommendations for a shower seat. CM will have this delivered to the room per [employer].   Expected Discharge Plan: Home w Home Health Services Barriers to Discharge: Continued Medical Work up   Patient Goals and CMS Choice   CMS Medicare.gov Compare Post Acute Care list provided to:: Patient Choice offered to / list presented to : Patient  Expected Discharge Plan and Services Expected Discharge Plan: Home w Home Health Services   Discharge Planning Services: CM Consult Post Acute Care Choice: Durable Medical Equipment, Home Health Living arrangements for the past 2 months: Single Family Home Expected Discharge Date: 07/27/21                                    Prior Living Arrangements/Services Living arrangements for the past 2 months: Single Family Home Lives with:: Significant Other Patient language and need for interpreter reviewed:: Yes Do you feel safe going back to the place where you live?: Yes      Need for Family Participation in Patient Care: Yes (Comment) Care giver support system in place?: Yes (comment)   Criminal Activity/Legal Involvement Pertinent to Current Situation/Hospitalization: No - Comment as needed  Activities of Daily Living Home Assistive  Devices/Equipment: Blood pressure cuff, Scales ADL Screening (condition at time of admission) Patient's cognitive ability adequate to safely complete daily activities?: Yes Is the patient deaf or have difficulty hearing?: No Does the patient have difficulty seeing, even when wearing glasses/contacts?: Yes Does the patient have difficulty concentrating, remembering, or making decisions?: No Patient Alvarado to express need for assistance with ADLs?: Yes Does the patient have difficulty dressing or bathing?: No Independently performs ADLs?: Yes (appropriate for developmental age) Does the patient have difficulty walking or climbing stairs?: No Weakness of Legs: None Weakness of Arms/Hands: None  Permission Sought/Granted                  Emotional Assessment Appearance:: Appears stated age Attitude/Demeanor/Rapport: Engaged Affect (typically observed): Accepting Orientation: : Oriented to Self, Oriented to Place, Oriented to  Time, Oriented to Situation   Psych Involvement: No (comment)  Admission diagnosis:  Dizziness [R42] Weakness [R53.1] Stroke [facility]) [I63.9] Patient Active Problem List   Diagnosis Date Noted   Essential hypertension 07/26/2021   Hyperlipidemia 07/26/2021   Stroke (HCC) 07/25/2021   PCP:  Tyler Able, MD Pharmacy:   [phone] [place], [place] - [address] [place] [address] Phone: [phone] Fax: [phone]     Social Determinants of Health (SDOH) Interventions    Readmission Risk Interventions No flowsheet data found.

## 2021-07-27 NOTE — Evaluation (Signed)
Occupational Therapy Evaluation Patient Details Name: Tyler Alvarado MRN: [id number] DOB: 06/16/1946 Today's Date: 07/27/2021   History of Present Illness Tyler Alvarado is a 76 y.o. male presented to ED with dizziness, unsteady giat, LOB, weakness and decreased sensation on the L. 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   Clinical Impression   Tyler Alvarado reports being mod I PTA with use of low vision strategies, no AD and does not drive. He lives in a 1 level home with a basement (laundry & freezer), with his s/o who works part time. Upon evaluation pt demonstrated mild L weakness and some paresthesias in his L hand and foot. He was able to ambulate within the room without AD given min guard for safety only. Attempted RW use, pt more comfortable and steady without AD. Currently he completes ADLs with up to min guard A, verbal cues for grooming at the sink. Pt is legally blind, able to see high contrast items and shadows. He will benefit from OT acutely to progress low vision strategies, stroke education and compensatory techniques to apply to the home setting. Recommend home with HHOT.      Recommendations for follow up therapy are one component of a multi-disciplinary discharge planning process, led by the attending physician.  Recommendations may be updated based on patient status, additional functional criteria and insurance authorization.   Follow Up Recommendations  Home health OT       Patient can return home with the following Assistance with cooking/housework;Assist for transportation    Functional Status Assessment  Patient has had a recent decline in their functional status and demonstrates the ability to make significant improvements in function in a reasonable and predictable amount of time.  Equipment Recommendations  Tub/shower bench       Precautions / Restrictions  Precautions Precautions: Fall Precaution Comments: low vision Restrictions Weight Bearing Restrictions: No      Mobility Bed Mobility Overal bed mobility: Needs Assistance             General bed mobility comments: pt sitting EOB upon arrival - RN stating he is indep with bed mobility    Transfers Overall transfer level: Needs assistance Equipment used: None Transfers: Sit to/from Stand Sit to Stand: Min guard           General transfer comment: for safety only for 1st sit<>stand, pt distance supervision for 2nd sit<>stand at the chair      Balance Overall balance assessment: Needs assistance Sitting-balance support: Feet supported Sitting balance-Leahy Scale: Good     Standing balance support: No upper extremity supported;During functional activity Standing balance-Leahy Scale: Good                 ADL either performed or assessed with clinical judgement   ADL Overall ADL's : Needs assistance/impaired Eating/Feeding: Independent;Sitting   Grooming: Oral care;Supervision/safety;Standing;Cueing for compensatory techniques Grooming Details (indicate cue type and reason): pt required 1 verbal cue for opening the tooth brush package, otherwise completed with supervision only while standing at the sink Upper Body Bathing: Set up;Sitting   Lower Body Bathing: Min guard;Sit to/from stand   Upper Body Dressing : Set up;Sitting   Lower Body Dressing: Min guard;Sit to/from stand   Toilet Transfer: Min guard;Ambulation   Toileting- Clothing Manipulation and Hygiene: Supervision/safety;Sitting/lateral lean       Functional mobility during ADLs: Min guard General ADL Comments: min guard for ambulation without AD in the room.  Attempted to use RW, pt does better without to use BUE for room navigation. pt completed grooming at the sink wtih minimal cues, good stability, no LOB     Vision Baseline Vision/History: 2 Legally blind Ability to See in Adequate  Light: 4 Severely impaired Patient Visual Report: No change from baseline Vision Assessment?: Vision impaired- to be further tested in functional context Additional Comments: At baseline, pt is legally blind (since childhood). He denies any new changes. He is able to see high contrast shapes & shadows.            Pertinent Vitals/Pain Pain Assessment: No/denies pain     Hand Dominance Right   Extremity/Trunk Assessment Upper Extremity Assessment Upper Extremity Assessment: LUE deficits/detail LUE Deficits / Details: globally 4/5 MMT. some numbness throughout the hand. thumb to finger coordination is slower & more deliberate on the L. Pt able to use funcitonally for grooming at the sink. LUE Sensation: decreased light touch LUE Coordination: decreased fine motor   Lower Extremity Assessment Lower Extremity Assessment: Defer to PT evaluation   Cervical / Trunk Assessment Cervical / Trunk Assessment: Normal   Communication Communication Communication: No difficulties   Cognition Arousal/Alertness: Awake/alert Behavior During Therapy: WFL for tasks assessed/performed Overall Cognitive Status: Within Functional Limits for tasks assessed           General Comments: Pt has great insight to deficits and was able to recall stroke location and education provided by neurologist.     General Comments  VSS on RA, no family present     Home Living Family/patient expects to be discharged to:: Private residence Living Arrangements: Spouse/significant other Available Help at Discharge: Family;Available PRN/intermittently;Available 24 hours/day Type of Home: House Home Access: Stairs to enter [employer] of Steps: 5 Entrance Stairs-Rails: Right;Left Home Layout: Multi-level;Laundry or work area in Artist of Steps: 8   Bathroom Shower/Tub: Chief Strategy Officer: Handicapped height     Home Equipment: None (make-shift grab  bar for shower)          Prior Functioning/Environment Prior Level of Function : Independent/Modified Independent             Mobility Comments: indep, no AD ADLs Comments: indep, does not drive        OT Problem List: Impaired sensation;Impaired vision/perception;Decreased coordination;Decreased safety awareness;Decreased knowledge of precautions      OT Treatment/Interventions: Therapeutic exercise;Self-care/ADL training;Balance training;Patient/family education;Therapeutic activities;DME and/or AE instruction    OT Goals(Current goals can be found in the care plan section) Acute Rehab OT Goals Patient Stated Goal: home soon OT Goal Formulation: With patient Time For Goal Achievement: 08/10/21 Potential to Achieve Goals: Good ADL Goals Pt Will Transfer to Toilet: Independently;ambulating Pt Will Perform Tub/Shower Transfer: with modified independence;ambulating;tub bench Additional ADL Goal #1: Pt will independently complete BADLs Additional ADL Goal #2: Pt will indep recall at least 3 low vision strategies to apply to the home setting  OT Frequency: Min 2X/week       AM-PAC OT "6 Clicks" Daily Activity     Outcome Measure Help from another person eating meals?: None Help from another person taking care of personal grooming?: A Little Help from another person toileting, which includes using toliet, bedpan, or urinal?: A Little Help from another person bathing (including washing, rinsing, drying)?: A Little Help from another person to put on and taking off regular upper body clothing?: None Help from another person to put on and taking off regular lower body  clothing?: A Little 6 Click Score: 20   End of Session Equipment Utilized During Treatment: Gait belt;Rolling walker (2 wheels) Nurse Communication: Mobility status  Activity Tolerance: Patient tolerated treatment well Patient left: in chair;with call bell/phone within reach;with chair alarm set  OT Visit  Diagnosis: Unsteadiness on feet (R26.81);Other abnormalities of gait and mobility (R26.89);Muscle weakness (generalized) (M62.81);Pain;Hemiplegia and hemiparesis Hemiplegia - Right/Left: Left Hemiplegia - dominant/non-dominant: Non-Dominant Hemiplegia - caused by: Cerebral infarction                Time: 0811-0840 OT Time Calculation (min): 29 min Charges:  OT General Charges $OT Visit: 1 Visit OT Evaluation $OT Eval Moderate Complexity: 1 Mod OT Treatments $Self Care/Home Management : 8-22 mins   Marleta Lapierre A Christopher Hink 07/27/2021, 9:15 AM

## 2021-07-27 NOTE — Progress Notes (Addendum)
STROKE TEAM PROGRESS NOTE   INTERVAL HISTORY Patient seen and assessed at bedside. Patient reports neurologically being back at his baseline. Patient did sign consent and is participating in the sleep smart study for stroke prevention and tested positive for sleep apnea on the overnight NOx 3 monitor.  We will have the CPAP mask  tolerability test tonight . Vitals:   07/26/21 2000 07/26/21 2319 07/27/21 0429 07/27/21 0751  BP: (!) 147/92 (!) 154/85 (!) 144/84 (!) 153/85  Pulse: 70 80 70 75  Resp: 18 18 18 18   Temp: 98.2 F (36.8 C) 98.1 F (36.7 C) 98.3 F (36.8 C) 98.3 F (36.8 C)  TempSrc: Oral Oral Oral Oral  SpO2: 97% 99% 98% 100%   CBC:  Recent Labs  Lab 07/25/21 1401 07/25/21 1403  WBC 6.1  --   NEUTROABS 3.5  --   HGB 13.9 15.6  HCT 43.3 46.0  MCV 92.3  --   PLT 248  --     Basic Metabolic Panel:  Recent Labs  Lab 07/25/21 1401 07/25/21 1403  NA 132* 139  K 3.6 3.7  CL 104 102  CO2 24  --   GLUCOSE 97 97  BUN 14 15  CREATININE 1.00 0.90  CALCIUM 8.5*  --     Lipid Panel:  Recent Labs  Lab 07/26/21 0324  CHOL 190  TRIG 51  HDL 43  CHOLHDL 4.4  VLDL 10  LDLCALC 137*    HgbA1c:  Recent Labs  Lab 07/26/21 0324  HGBA1C 6.0*    Urine Drug Screen: No results for input(s): LABOPIA, COCAINSCRNUR, LABBENZ, AMPHETMU, THCU, LABBARB in the last 168 hours.  Alcohol Level No results for input(s): ETH in the last 168 hours.  IMAGING past 24 hours ECHOCARDIOGRAM COMPLETE  Result Date: 07/26/2021    ECHOCARDIOGRAM REPORT   Patient Name:   Tyler Alvarado Date of Exam: 07/26/2021 Medical Rec #:  [id number]        Height:       67.0 in Accession #:    [id number]       Weight:       217.0 lb Date of Birth:  23-Jan-1946         BSA:          2.094 m Patient Age:    76 years         BP:           140/81 mmHg Patient Gender: M                HR:           71 bpm. Exam Location:  Inpatient Procedure: 2D Echo Indications:    stroke  History:        Patient has no  prior history of Echocardiogram examinations.  Sonographer:    Johny Chess RDCS Referring Phys: Theola Sequin  Sonographer Comments: Image acquisition challenging due to respiratory motion. IMPRESSIONS  1. Left ventricular ejection fraction, by estimation, is 55 to 60%. The left ventricle has normal function. The left ventricle has no regional wall motion abnormalities. There is mild concentric left ventricular hypertrophy. Left ventricular diastolic parameters are consistent with Grade I diastolic dysfunction (impaired relaxation).  2. Right ventricular systolic function is normal. The right ventricular size is normal. Tricuspid regurgitation signal is inadequate for assessing PA pressure.  3. The mitral valve is normal in structure. No evidence of mitral valve regurgitation. No evidence of mitral stenosis.  4. The aortic  valve is tricuspid. Aortic valve regurgitation is not visualized. No aortic stenosis is present.  5. The inferior vena cava is normal in size with greater than 50% respiratory variability, suggesting right atrial pressure of 3 mmHg. Comparison(s): No prior Echocardiogram. FINDINGS  Left Ventricle: Left ventricular ejection fraction, by estimation, is 55 to 60%. The left ventricle has normal function. The left ventricle has no regional wall motion abnormalities. The left ventricular internal cavity size was normal in size. There is  mild concentric left ventricular hypertrophy. Left ventricular diastolic parameters are consistent with Grade I diastolic dysfunction (impaired relaxation). Right Ventricle: The right ventricular size is normal. No increase in right ventricular wall thickness. Right ventricular systolic function is normal. Tricuspid regurgitation signal is inadequate for assessing PA pressure. Left Atrium: Left atrial size was normal in size. Right Atrium: Right atrial size was normal in size. Pericardium: There is no evidence of pericardial effusion. Mitral Valve: The mitral  valve is normal in structure. No evidence of mitral valve regurgitation. No evidence of mitral valve stenosis. Tricuspid Valve: The tricuspid valve is normal in structure. Tricuspid valve regurgitation is not demonstrated. No evidence of tricuspid stenosis. Aortic Valve: The aortic valve is tricuspid. Aortic valve regurgitation is not visualized. No aortic stenosis is present. Pulmonic Valve: The pulmonic valve was grossly normal. Pulmonic valve regurgitation is not visualized. No evidence of pulmonic stenosis. Aorta: The aortic root and ascending aorta are structurally normal, with no evidence of dilitation. Venous: The inferior vena cava is normal in size with greater than 50% respiratory variability, suggesting right atrial pressure of 3 mmHg. IAS/Shunts: No atrial level shunt detected by color flow Doppler.  LEFT VENTRICLE PLAX 2D LVIDd:         4.00 cm   Diastology LVIDs:         2.70 cm   LV e' medial:    8.81 cm/s LV PW:         1.20 cm   LV E/e' medial:  6.5 LV IVS:        1.10 cm   LV e' lateral:   10.00 cm/s LVOT diam:     2.00 cm   LV E/e' lateral: 5.7 LV SV:         61 LV SV Index:   29 LVOT Area:     3.14 cm  RIGHT VENTRICLE             IVC RV S prime:     14.40 cm/s  IVC diam: 1.50 cm TAPSE (M-mode): 1.8 cm LEFT ATRIUM             Index        RIGHT ATRIUM           Index LA diam:        3.60 cm 1.72 cm/m   RA Area:     12.20 cm LA Vol (A2C):   33.4 ml 15.95 ml/m  RA Volume:   27.30 ml  13.04 ml/m LA Vol (A4C):   41.3 ml 19.73 ml/m LA Biplane Vol: 40.3 ml 19.25 ml/m  AORTIC VALVE LVOT Vmax:   99.50 cm/s LVOT Vmean:  65.600 cm/s LVOT VTI:    0.194 m  AORTA Ao Root diam: 2.90 cm Ao Asc diam:  3.00 cm MITRAL VALVE MV Area (PHT): 2.66 cm    SHUNTS MV Decel Time: 285 msec    Systemic VTI:  0.19 m MV E velocity: 57.40 cm/s  Systemic Diam: 2.00 cm MV A velocity: 73.80  cm/s MV E/A ratio:  0.78 Rudean Haskell MD Electronically signed by Rudean Haskell MD Signature Date/Time:  07/26/2021/12:47:17 PM    Final     PHYSICAL EXAM  Temp:  [97.9 F (36.6 C)-98.7 F (37.1 C)] 98.3 F (36.8 C) (01/16 0751) Pulse Rate:  [69-88] 75 (01/16 0751) Resp:  [12-18] 18 (01/16 0751) BP: (141-154)/(77-92) 153/85 (01/16 0751) SpO2:  [94 %-100 %] 100 % (01/16 0751)  General -mildly obese elderly African-American male.  In no apparent distress.  Cardiovascular - Regular rhythm and rate.  Mental Status -  Level of arousal and orientation to time, place, and person were intact. Language including expression, naming, repetition, comprehension was assessed and found intact. Attention span and concentration were normal. Recent and remote memory were intact. Fund of Knowledge was assessed and was intact.  Corneal opacity in left pupil. Right pupil round and reactive to light. Rhythmic pendular nystagmus bilaterally in all directions of gaze. Facial sensation intact bilaterally, face symmetric, tongue, and palate midline.   Motor Strength - The patients strength was normal in all extremities and pronator drift was absent.  Bulk was normal and fasciculations were absent.   Motor Tone - Muscle tone was assessed at the neck and appendages and was normal.  Reflexes - Weak cough reflex. No pathological reflexes otherwise noted.   Sensory - Light touch, temperature/pinprick were assessed and were symmetrical.    Coordination - The patient had normal movements in the hands and feet with no ataxia or dysmetria.  Tremor was absent.  Gait and Station - deferred.  NIH stroke scale 2.    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 male with history of HTN, congenital nystagmus, corneal scarring and keratoconus presenting with unsteady gait, loss of balance, left sided weakness and left sided decrease in sensation.   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 No acute hemorrhage.   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 Mild to moderate chronic microvascular ischemic changes. 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 Yes; Fluid consistency: Thin   No antithrombotic prior to admission, now on aspirin 81 mg daily and clopidogrel 75 mg daily. Recommend ASA+Plavix for 3 months followed by ASA indefinitely Therapy recommendations:  home PT/OT Disposition:  pending  Obstructive Sleep Apnea -Trial of CPAP machine tonight  Hypertension Home meds: Norvasc 10 mg Stable Permissive hypertension (OK if < 220/120) but gradually normalize in 5-7 days Long-term BP goal normotensive  Hyperlipidemia Home meds:  none LDL 137, goal < 70 Started on lipitor 80 this admission Continue statin at discharge  Other Stroke Risk Factors Advanced Age >/= 60  Obesity,  BMI >/= 30 associated with increased stroke risk, recommend weight loss, diet and exercise as appropriate  Obstructive sleep apnea, not on CPAP at home   Hospital day # 2  France Ravens, MD PGY1 Resdient  I have personally obtained history,examined this patient, reviewed notes, independently viewed imaging studies, participated in medical decision making and plan of care.ROS completed by me personally and pertinent positives fully documented  I have made any additions or clarifications directly  to the above note. Agree with note above.  Patient tested positive close sleep apnea on the overnight NOx 3 monitor and is participating in the sleep smart study.  He will have the CPAP mask tolerability test tonight.  Continue aspirin and Plavix for 3 months  followed by aspirin alone and aggressive risk factor modification.  No family available at the bedside for discussion.  Discussed with Dr. Karleen Hampshire.  Greater than 50% time during this 35-minute visit was spent in counseling and coordination of care about his stroke and new diagnosis of sleep apnea and answering questions.  Antony Contras, MD Medical Director [place] Pager: [phone] 07/27/2021 12:42 PM    To contact Stroke Continuity provider, please refer to [email or web address] After hours, contact General Neurology

## 2021-07-27 NOTE — Progress Notes (Signed)
PROGRESS NOTE    Tyler Alvarado  [id number] DOB: Aug 29, 1945 DOA: 07/25/2021 PCP: Lin Landsman, MD    Chief Complaint  Patient presents with   Dizziness    Brief Narrative:   Tyler Alvarado is a 76 y.o. male with medical history significant of  hypertension, keratoconus, congenital nystagmus, corneal scarring, presents to ED for dizziness started yesterday morning, along with unsteady gait, loss of balance, almost fell,  weakness on the left compared to the right side of the body, along with decreased sensation on the left side. He was found to have Age indeterminate ischemic changes within the bilateral frontal subcortical white matter, left parietal subcortical white matter, and inferior right cerebellar hemisphere.  He was admitted for stroke work up.    Assessment & Plan:   Principal Problem:   Stroke [facility]) Active Problems:   Essential hypertension   Hyperlipidemia   Acute stroke : MRI brain showed Suspected small acute infarct of the right ventral medulla. Mild to moderate chronic microvascular ischemic changes. 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 Neurology consulted , started on Aspirin 81 mg daily, plavix 75 mg daily for 3 months followed by aspirin indefinitely.  LDL is  137 , started him on lipitor.  Hemoglobin A1c is 6.  Therapy evaluations are recommending home health PT/OT.  Echocardiogram showed LVEF of 55%.  Await further recommendations of neurology.    Hypertension:  Well controlled. On amlodipine at home.  Permissive hypertension for 48 to 72 hours and we will start resuming amlodipine from tomorrow.    Hyperlipidemia:  LDL is 137, started on Lipitor  OSA:  Trial of CPAP tonight.    DVT prophylaxis: (Lovenox) Code Status: (Full code) Family Communication: none at bedside.  Disposition:   Status is: Inpatient  Remains inpatient appropriate because: stroke work up.         Consultants:  Neurology.   Procedures: Echo Left ventricular ejection fraction, by estimation, is 55 to 60%. The  left ventricle has normal function. The left ventricle has no regional  wall motion abnormalities. There is mild concentric left ventricular  hypertrophy. Left ventricular diastolic  parameters are consistent with Grade I diastolic dysfunction (impaired  relaxation).   Antimicrobials: none.    Subjective: IMPROVING sensation on the left. No dizziness.   Objective: Vitals:   07/26/21 2319 07/27/21 0429 07/27/21 0751 07/27/21 1118  BP: (!) 154/85 (!) 144/84 (!) 153/85 (!) 146/90  Pulse: 80 70 75 91  Resp: 18 18 18 18   Temp: 98.1 F (36.7 C) 98.3 F (36.8 C) 98.3 F (36.8 C) 98.5 F (36.9 C)  TempSrc: Oral Oral Oral Oral  SpO2: 99% 98% 100% 100%    Intake/Output Summary (Last 24 hours) at 07/27/2021 1240 Last data filed at 07/26/2021 1515 Gross per 24 hour  Intake 0 ml  Output --  Net 0 ml   There were no vitals filed for this visit.  Examination:  General exam: Appears calm and comfortable  Respiratory system: Clear to auscultation. Respiratory effort normal. Cardiovascular system: S1 & S2 heard, RRR. No JVD,  No pedal edema. Gastrointestinal system: Abdomen is nondistended, soft and nontender.Normal bowel sounds heard. Central nervous system: Alert and oriented. Improving sensation on the left.  Extremities: Symmetric 5 x 5 power. Skin: No rashes, lesions or ulcers Psychiatry: Mood & affect appropriate.      Data Reviewed: I have personally reviewed following labs and imaging studies  CBC: Recent Labs  Lab 07/25/21 1401  07/25/21 1403  WBC 6.1  --   NEUTROABS 3.5  --   HGB 13.9 15.6  HCT 43.3 46.0  MCV 92.3  --   PLT 248  --      Basic Metabolic Panel: Recent Labs  Lab 07/25/21 1401 07/25/21 1403  NA 132* 139  K 3.6 3.7  CL 104 102  CO2 24  --   GLUCOSE 97 97  BUN 14 15  CREATININE 1.00 0.90  CALCIUM 8.5*  --       GFR: CrCl cannot be calculated (Unknown ideal weight.).  Liver Function Tests: Recent Labs  Lab 07/25/21 1401  AST 17  ALT 21  ALKPHOS 78  BILITOT 0.7  PROT 8.5*  ALBUMIN 3.6     CBG: No results for input(s): GLUCAP in the last 168 hours.   Recent Results (from the past 240 hour(s))  Resp Panel by RT-PCR (Flu A&B, Covid) Nasopharyngeal Swab     Status: None   Collection Time: 07/25/21  6:21 PM   Specimen: Nasopharyngeal Swab; Nasopharyngeal(NP) swabs in vial transport medium  Result Value Ref Range Status   SARS Coronavirus 2 by RT PCR NEGATIVE NEGATIVE Final    Comment: (NOTE) SARS-CoV-2 target nucleic acids are NOT DETECTED.  The SARS-CoV-2 RNA is generally detectable in upper respiratory specimens during the acute phase of infection. The lowest concentration of SARS-CoV-2 viral copies this assay can detect is 138 copies/mL. A negative result does not preclude SARS-Cov-2 infection and should not be used as the sole basis for treatment or other patient management decisions. A negative result may occur with  improper specimen collection/handling, submission of specimen other than nasopharyngeal swab, presence of viral mutation(s) within the areas targeted by this assay, and inadequate number of viral copies(<138 copies/mL). A negative result must be combined with clinical observations, patient history, and epidemiological information. The expected result is Negative.  Fact Sheet for Patients:  EntrepreneurPulse.com.au  Fact Sheet for Healthcare Providers:  IncredibleEmployment.be  This test is no t yet approved or cleared by the [place] FDA and  has been authorized for detection and/or diagnosis of SARS-CoV-2 by FDA under an Emergency Use Authorization (EUA). This EUA will remain  in effect (meaning this test can be used) for the duration of the COVID-19 declaration under Section 564(b)(1) of the Act,  21 U.S.C.section 360bbb-3(b)(1), unless the authorization is terminated  or revoked sooner.       Influenza A by PCR NEGATIVE NEGATIVE Final   Influenza B by PCR NEGATIVE NEGATIVE Final    Comment: (NOTE) The Xpert Xpress SARS-CoV-2/FLU/RSV plus assay is intended as an aid in the diagnosis of influenza from Nasopharyngeal swab specimens and should not be used as a sole basis for treatment. Nasal washings and aspirates are unacceptable for Xpert Xpress SARS-CoV-2/FLU/RSV testing.  Fact Sheet for Patients: EntrepreneurPulse.com.au  Fact Sheet for Healthcare Providers: IncredibleEmployment.be  This test is not yet approved or cleared by the [place] FDA and has been authorized for detection and/or diagnosis of SARS-CoV-2 by FDA under an Emergency Use Authorization (EUA). This EUA will remain in effect (meaning this test can be used) for the duration of the COVID-19 declaration under Section 564(b)(1) of the Act, 21 U.S.C. section 360bbb-3(b)(1), unless the authorization is terminated or revoked.  Performed at [facility], [place] [address], [place],  [address]           Radiology Studies: CT HEAD WO CONTRAST (5MM)  Result Date: 07/25/2021 CLINICAL DATA:  Dizziness  and left-sided weakness since 5 a.m. yesterday EXAM: CT HEAD WITHOUT CONTRAST TECHNIQUE: Contiguous axial images were obtained from the base of the skull through the vertex without intravenous contrast. RADIATION DOSE REDUCTION: This exam was performed according to the departmental dose-optimization program which includes automated exposure control, adjustment of the mA and/or kV according to patient size and/or use of iterative reconstruction technique. COMPARISON:  None. FINDINGS: Brain: Subcortical white matter hypodensities are seen within the bilateral frontal regions and left parietal region, consistent with age-indeterminate small vessel ischemic change. Focal  hypodensity inferior right cerebellar hemisphere consistent with age indeterminate cerebellar infarct. No other signs of acute infarct or hemorrhage. Lateral ventricles and midline structures are unremarkable. No acute extra-axial fluid collections. No mass effect. Vascular: No hyperdense vessel or unexpected calcification. Skull: Normal. Negative for fracture or focal lesion. Sinuses/Orbits: Postsurgical changes left globe, with scleral buckle and metallic clip identified. Paranasal sinuses are unremarkable. Other: None. IMPRESSION: 1. Age indeterminate ischemic changes within the bilateral frontal subcortical white matter, left parietal subcortical white matter, and inferior right cerebellar hemisphere. 2. No evidence of acute hemorrhage. 3. Postsurgical changes left globe. Electronically Signed   By: Randa Ngo M.D.   On: 07/25/2021 15:31   MR ANGIO HEAD WO CONTRAST  Addendum Date: 07/25/2021   ADDENDUM REPORT: 07/25/2021 19:28 ADDENDUM: Contrast dose is 10 mL Gadavist Electronically Signed   By: Macy Mis M.D.   On: 07/25/2021 19:28   Result Date: 07/25/2021 CLINICAL DATA:  Stroke, follow up; neuro deficit, acute, stroke suspected EXAM: MRI HEAD WITHOUT CONTRAST MRA HEAD WITHOUT CONTRAST MRA NECK WITHOUT AND WITH CONTRAST TECHNIQUE: Multiplanar, multi-echo pulse sequences of the brain and surrounding structures were acquired without intravenous contrast. Angiographic images of the Circle of Willis were acquired using MRA technique without intravenous contrast. Angiographic images of the neck were acquired using MRA technique without and with intravenous contrast. Carotid stenosis measurements (when applicable) are obtained utilizing NASCET criteria, using the distal internal carotid diameter as the denominator. COMPARISON:  None FINDINGS: MRI HEAD Brain: Small focus of reduced diffusion is present along the right ventral medulla (series 2, image 10). There is no evidence of intracranial  hemorrhage. Prominent left cerebellar developmental 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 anomaly with some associated T2 hyperintensity that may reflect gliosis less likely edema from venous congestion. Patchy and confluent areas of T2 hyperintensity in the supratentorial white matter are nonspecific but may reflect mild to moderate chronic microvascular ischemic changes. Scattered prominent perivascular spaces and/or chronic small vessel infarcts. There is no intracranial mass, mass effect, or edema. There is no hydrocephalus or extra-axial fluid collection. Vascular: Major vessel flow voids at the skull base are preserved. Skull and upper cervical spine: Normal marrow signal is preserved. Sinuses/Orbits: Paranasal sinuses are aerated. Orbits are unremarkable. Other: Sella is unremarkable.  Mastoid air cells are clear. MRA HEAD Intracranial internal carotid arteries are patent with atherosclerotic irregularity. Middle and anterior cerebral arteries are patent. There is diminished flow related enhancement of the intracranial right vertebral artery. On the post-contrast neck imaging, there is irregularity of the intracranial vertebral artery with high-grade stenosis distally. Intracranial left vertebral arteries, basilar artery, posterior cerebral arteries are patent. Bilateral posterior communicating arteries are present. There is no significant stenosis or aneurysm. MRA NECK Common, internal, and external carotid arteries are patent. No hemodynamically significant stenosis. Retropharyngeal course of the right ICA. Dominant left vertebral artery is patent. Right vertebral artery is patent. 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 Mild to moderate chronic microvascular ischemic changes. 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 Electronically Signed: By: Guadlupe Spanish M.D. On: 07/25/2021 19:19   MR ANGIO NECK W  WO CONTRAST  Addendum Date: 07/25/2021   ADDENDUM REPORT: 07/25/2021 19:28 ADDENDUM: Contrast dose is 10 mL Gadavist Electronically Signed   By: Guadlupe Spanish M.D.   On: 07/25/2021 19:28   Result Date: 07/25/2021 CLINICAL DATA:  Stroke, follow up; neuro deficit, acute, stroke suspected EXAM: MRI HEAD WITHOUT CONTRAST MRA HEAD WITHOUT CONTRAST MRA NECK WITHOUT AND WITH CONTRAST TECHNIQUE: Multiplanar, multi-echo pulse sequences of the brain and surrounding structures were acquired without intravenous contrast. Angiographic images of the Circle of Willis were acquired using MRA technique without intravenous contrast. Angiographic images of the neck were acquired using MRA technique without and with intravenous contrast. Carotid stenosis measurements (when applicable) are obtained utilizing NASCET criteria, using the distal internal carotid diameter as the denominator. COMPARISON:  None FINDINGS: MRI HEAD Brain: Small focus of reduced diffusion is present along the right ventral medulla (series 2, image 10). There is no evidence of intracranial hemorrhage. Prominent left cerebellar developmental 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 anomaly with some associated T2 hyperintensity that may reflect gliosis less likely edema from venous congestion. Patchy and confluent areas of T2 hyperintensity in the supratentorial white matter are nonspecific but may reflect mild to moderate chronic microvascular ischemic changes. Scattered prominent perivascular spaces and/or chronic small vessel infarcts. There is no intracranial mass, mass effect, or edema. There is no hydrocephalus or extra-axial fluid collection. Vascular: Major vessel flow voids at the skull base are preserved. Skull and upper cervical spine: Normal marrow signal is preserved. Sinuses/Orbits: Paranasal sinuses are aerated. Orbits are unremarkable. Other: Sella is unremarkable.  Mastoid air cells are clear. MRA HEAD Intracranial  internal carotid arteries are patent with atherosclerotic irregularity. Middle and anterior cerebral arteries are patent. There is diminished flow related enhancement of the intracranial right vertebral artery. On the post-contrast neck imaging, there is irregularity of the intracranial vertebral artery with high-grade stenosis distally. Intracranial left vertebral arteries, basilar artery, posterior cerebral arteries are patent. Bilateral posterior communicating arteries are present. There is no significant stenosis or aneurysm. MRA NECK Common, internal, and external carotid arteries are patent. No hemodynamically significant stenosis. Retropharyngeal course of the right ICA. Dominant left vertebral artery is patent. Right vertebral artery is patent. 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 Mild to moderate chronic microvascular ischemic changes. 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 Electronically Signed: By: Guadlupe Spanish M.D. On: 07/25/2021 19:19   MR BRAIN WO CONTRAST  Addendum Date: 07/25/2021   ADDENDUM REPORT: 07/25/2021 19:28 ADDENDUM: Contrast dose is 10 mL Gadavist Electronically Signed   By: Guadlupe Spanish M.D.   On: 07/25/2021 19:28   Result Date: 07/25/2021 CLINICAL DATA:  Stroke, follow up; neuro deficit, acute, stroke suspected EXAM: MRI HEAD WITHOUT CONTRAST MRA HEAD WITHOUT CONTRAST MRA NECK WITHOUT AND WITH CONTRAST TECHNIQUE: Multiplanar, multi-echo pulse sequences of the brain and surrounding structures were acquired without intravenous contrast. Angiographic images of the Circle of Willis were acquired using MRA technique without intravenous contrast. Angiographic images of the neck were acquired using MRA technique without and with intravenous contrast. Carotid stenosis measurements (when applicable) are obtained utilizing NASCET criteria, using the distal internal carotid diameter as the denominator.  COMPARISON:  None FINDINGS: MRI HEAD Brain: Small focus of reduced diffusion is present  along the right ventral medulla (series 2, image 10). There is no evidence of intracranial hemorrhage. Prominent left cerebellar developmental 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 anomaly with some associated T2 hyperintensity that may reflect gliosis less likely edema from venous congestion. Patchy and confluent areas of T2 hyperintensity in the supratentorial white matter are nonspecific but may reflect mild to moderate chronic microvascular ischemic changes. Scattered prominent perivascular spaces and/or chronic small vessel infarcts. There is no intracranial mass, mass effect, or edema. There is no hydrocephalus or extra-axial fluid collection. Vascular: Major vessel flow voids at the skull base are preserved. Skull and upper cervical spine: Normal marrow signal is preserved. Sinuses/Orbits: Paranasal sinuses are aerated. Orbits are unremarkable. Other: Sella is unremarkable.  Mastoid air cells are clear. MRA HEAD Intracranial internal carotid arteries are patent with atherosclerotic irregularity. Middle and anterior cerebral arteries are patent. There is diminished flow related enhancement of the intracranial right vertebral artery. On the post-contrast neck imaging, there is irregularity of the intracranial vertebral artery with high-grade stenosis distally. Intracranial left vertebral arteries, basilar artery, posterior cerebral arteries are patent. Bilateral posterior communicating arteries are present. There is no significant stenosis or aneurysm. MRA NECK Common, internal, and external carotid arteries are patent. No hemodynamically significant stenosis. Retropharyngeal course of the right ICA. Dominant left vertebral artery is patent. Right vertebral artery is patent. 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 Mild to moderate chronic microvascular ischemic changes.  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 Electronically Signed: By: Macy Mis M.D. On: 07/25/2021 19:19   ECHOCARDIOGRAM COMPLETE  Result Date: 07/26/2021    ECHOCARDIOGRAM REPORT   Patient Name:   Tyler Alvarado Date of Exam: 07/26/2021 Medical Rec #:  [id number]        Height:       67.0 in Accession #:    [id number]       Weight:       217.0 lb Date of Birth:  June 30, 1946         BSA:          2.094 m Patient Age:    46 years         BP:           140/81 mmHg Patient Gender: M                HR:           71 bpm. Exam Location:  Inpatient Procedure: 2D Echo Indications:    stroke  History:        Patient has no prior history of Echocardiogram examinations.  Sonographer:    Johny Chess RDCS Referring Phys: Theola Sequin  Sonographer Comments: Image acquisition challenging due to respiratory motion. IMPRESSIONS  1. Left ventricular ejection fraction, by estimation, is 55 to 60%. The left ventricle has normal function. The left ventricle has no regional wall motion abnormalities. There is mild concentric left ventricular hypertrophy. Left ventricular diastolic parameters are consistent with Grade I diastolic dysfunction (impaired relaxation).  2. Right ventricular systolic function is normal. The right ventricular size is normal. Tricuspid regurgitation signal is inadequate for assessing PA pressure.  3. The mitral valve is normal in structure. No evidence of mitral valve regurgitation. No evidence of mitral stenosis.  4. The aortic valve is tricuspid. Aortic valve regurgitation is not visualized. No aortic stenosis is present.  5. The inferior vena cava is normal in size with greater than 50%  respiratory variability, suggesting right atrial pressure of 3 mmHg. Comparison(s): No prior Echocardiogram. FINDINGS  Left Ventricle: Left ventricular ejection fraction, by estimation, is 55 to 60%. The left ventricle has normal function. The left ventricle  has no regional wall motion abnormalities. The left ventricular internal cavity size was normal in size. There is  mild concentric left ventricular hypertrophy. Left ventricular diastolic parameters are consistent with Grade I diastolic dysfunction (impaired relaxation). Right Ventricle: The right ventricular size is normal. No increase in right ventricular wall thickness. Right ventricular systolic function is normal. Tricuspid regurgitation signal is inadequate for assessing PA pressure. Left Atrium: Left atrial size was normal in size. Right Atrium: Right atrial size was normal in size. Pericardium: There is no evidence of pericardial effusion. Mitral Valve: The mitral valve is normal in structure. No evidence of mitral valve regurgitation. No evidence of mitral valve stenosis. Tricuspid Valve: The tricuspid valve is normal in structure. Tricuspid valve regurgitation is not demonstrated. No evidence of tricuspid stenosis. Aortic Valve: The aortic valve is tricuspid. Aortic valve regurgitation is not visualized. No aortic stenosis is present. Pulmonic Valve: The pulmonic valve was grossly normal. Pulmonic valve regurgitation is not visualized. No evidence of pulmonic stenosis. Aorta: The aortic root and ascending aorta are structurally normal, with no evidence of dilitation. Venous: The inferior vena cava is normal in size with greater than 50% respiratory variability, suggesting right atrial pressure of 3 mmHg. IAS/Shunts: No atrial level shunt detected by color flow Doppler.  LEFT VENTRICLE PLAX 2D LVIDd:         4.00 cm   Diastology LVIDs:         2.70 cm   LV e' medial:    8.81 cm/s LV PW:         1.20 cm   LV E/e' medial:  6.5 LV IVS:        1.10 cm   LV e' lateral:   10.00 cm/s LVOT diam:     2.00 cm   LV E/e' lateral: 5.7 LV SV:         61 LV SV Index:   29 LVOT Area:     3.14 cm  RIGHT VENTRICLE             IVC RV S prime:     14.40 cm/s  IVC diam: 1.50 cm TAPSE (M-mode): 1.8 cm LEFT ATRIUM              Index        RIGHT ATRIUM           Index LA diam:        3.60 cm 1.72 cm/m   RA Area:     12.20 cm LA Vol (A2C):   33.4 ml 15.95 ml/m  RA Volume:   27.30 ml  13.04 ml/m LA Vol (A4C):   41.3 ml 19.73 ml/m LA Biplane Vol: 40.3 ml 19.25 ml/m  AORTIC VALVE LVOT Vmax:   99.50 cm/s LVOT Vmean:  65.600 cm/s LVOT VTI:    0.194 m  AORTA Ao Root diam: 2.90 cm Ao Asc diam:  3.00 cm MITRAL VALVE MV Area (PHT): 2.66 cm    SHUNTS MV Decel Time: 285 msec    Systemic VTI:  0.19 m MV E velocity: 57.40 cm/s  Systemic Diam: 2.00 cm MV A velocity: 73.80 cm/s MV E/A ratio:  0.78 Rudean Haskell MD Electronically signed by Rudean Haskell MD Signature Date/Time: 07/26/2021/12:47:17 PM    Final  Scheduled Meds:   stroke: mapping our early stages of recovery book   Does not apply Once   amLODipine  10 mg Oral Daily   aspirin EC  81 mg Oral Daily   atorvastatin  80 mg Oral Daily   clopidogrel  75 mg Oral Daily   enoxaparin (LOVENOX) injection  40 mg Subcutaneous Q24H   Continuous Infusions:  sodium chloride       LOS: 2 days        Hosie Poisson, MD [facility]   To contact the attending provider between 7A-7P or the covering provider during after hours 7P-7A, please log into the web site [email or web address] and access using universal [place] password for that web site. If you do not have the password, please call the hospital operator.  07/27/2021, 12:40 PM
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
## 2021-07-27 NOTE — Evaluation (Signed)
Physical Therapy Evaluation Patient Details Name: Tyler Alvarado MRN: [id number] DOB: 02/08/1946 Today's Date: 07/27/2021  History of Present Illness  Tyler Alvarado is a 76 y.o. male presented to ED with dizziness, unsteady giat, LOB, weakness and decreased sensation on the L. 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   Clinical Impression  Pt admitted with above diagnosis. Pt currently with functional limitations due to the deficits listed below (see PT Problem List). At the time of PT eval pt was able to perform transfers and ambulation with gross min guard assist to supervision for safety and HHA for way finding due to low vision. Pt with nystagmus throughout session however did not report any dizziness or lightheadedness throughout session. Pt reports he is near baseline of function and anticipate he will function better in his environment where he is more familiar with layout. He is agreeable to HHPT follow up to maximize safety and functional return at d/c. Acutely, pt will benefit from skilled PT to increase their independence and safety with mobility to allow discharge to the venue listed below.          Recommendations for follow up therapy are one component of a multi-disciplinary discharge planning process, led by the attending physician.  Recommendations may be updated based on patient status, additional functional criteria and insurance authorization.  Follow Up Recommendations Home health PT    Assistance Recommended at Discharge Intermittent Supervision/Assistance  Patient can return home with the following  A little help with walking and/or transfers;Assist for transportation;Help with stairs or ramp for entrance    Equipment Recommendations None recommended by PT  Recommendations for Other Services       Functional Status Assessment Patient has had a recent decline in their functional  status and demonstrates the ability to make significant improvements in function in a reasonable and predictable amount of time.     Precautions / Restrictions Precautions Precautions: Fall Precaution Comments: low vision Restrictions Weight Bearing Restrictions: No      Mobility  Bed Mobility Overal bed mobility: Needs Assistance             General bed mobility comments: Pt was received sitting up in the recliner    Transfers Overall transfer level: Needs assistance Equipment used: None Transfers: Sit to/from Stand Sit to Stand: Supervision           General transfer comment: Supervision for safety. No assist required and no gross unsteadiness noted.    Ambulation/Gait Ambulation/Gait assistance: Min guard assist Gait Distance (Feet): 250 Feet Assistive device: 1 person hand held assist Gait Pattern/deviations: Step-through pattern;Decreased stride length;Drifts right/left;Trunk flexed Gait velocity: Decreased Gait velocity interpretation: 1.31 - 2.62 ft/sec, indicative of limited community ambulator   General Gait Details: Reaching out for support for way finding more than for balance support. HHA helpful from a balance perspective. Pt required cues for obstacles ahead due to low vision. No gross losses of balance noted and pt reports near baseline.  Stairs Stairs: Yes Stairs assistance: Min guard Stair Management: One rail Right;Alternating pattern;Step to pattern;Forwards Number of Stairs: 5 General stair comments: Practice set of stairs in the rehab gym. Pt was able to negotiate with VC's for number of stairs to expect and layout of practice stairs. Initially step-to pattern with progression to step-through. No gross LOB noted.  Wheelchair Mobility    Modified Rankin (Stroke Patients Only) Modified Rankin (Stroke Patients Only) Pre-Morbid Rankin Score: Slight  disability Modified Rankin: Moderately severe disability     Balance Overall balance  assessment: Needs assistance Sitting-balance support: Feet supported Sitting balance-Leahy Scale: Good     Standing balance support: No upper extremity supported;During functional activity Standing balance-Leahy Scale: Fair                               Pertinent Vitals/Pain Pain Assessment: No/denies pain    Home Living Family/patient expects to be discharged to:: Private residence Living Arrangements: Spouse/significant other Available Help at Discharge: Family;Available PRN/intermittently;Available 24 hours/day Type of Home: House Home Access: Stairs to enter Entrance Stairs-Rails: Right;Left Entrance Stairs-Number of Steps: 5 Alternate Level Stairs-Number of Steps: 8 Home Layout: Multi-level;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 ADLs Comments: indep, does not drive. Pt reports he has been cooking his own meals at home.     Hand Dominance   Dominant Hand: Right    Extremity/Trunk Assessment   Upper Extremity Assessment Upper Extremity Assessment: Defer to OT evaluation LUE Deficits / Details: globally 4/5 MMT. some numbness throughout the hand. thumb to finger coordination is slower & more deliberate on the L. Pt able to use funcitonally for grooming at the sink. LUE Sensation: decreased light touch LUE Coordination: decreased fine motor    Lower Extremity Assessment Lower Extremity Assessment: LLE deficits/detail LLE Deficits / Details: Slight strength deficit compared to RLE. Overall pt with great strength and 5/5 in quads, hamstrings. 4+/5 in hip flexor and ankle DF compared to 5/5 in all muscle groups on the RLE. LLE Sensation: WNL (Pt denies sensation changes)    Cervical / Trunk Assessment Cervical / Trunk Assessment: Normal  Communication   Communication: No  difficulties  Cognition Arousal/Alertness: Awake/alert Behavior During Therapy: WFL for tasks assessed/performed Overall Cognitive Status: Within Functional Limits for tasks assessed                                 General Comments: Pt has great insight to deficits and was able to recall stroke location and education provided by neurologist.        General Comments General comments (skin integrity, edema, etc.): VSS on RA, no family present    Exercises     Assessment/Plan    PT Assessment Patient needs continued PT services  PT Problem List Decreased strength;Decreased activity tolerance;Decreased balance;Decreased mobility;Decreased knowledge of use of DME;Decreased safety awareness;Decreased knowledge of precautions;Impaired sensation       PT Treatment Interventions DME instruction;Gait training;Stair training;Functional mobility training;Therapeutic activities;Therapeutic exercise;Neuromuscular re-education;Patient/family education    PT Goals (Current goals can be found in the Care Plan section)  Acute Rehab PT Goals Patient Stated Goal: Home at d/c - back to independence. PT Goal Formulation: With patient Time For Goal Achievement: 08/03/21 Potential to Achieve Goals: Good    Frequency Min 4X/week     Co-evaluation               AM-PAC PT "6 Clicks" Mobility  Outcome Measure Help needed turning from your back to your side while in a flat bed without using bedrails?: None Help needed moving from lying on your back to sitting on the side of a  flat bed without using bedrails?: None Help needed moving to and from a bed to a chair (including a wheelchair)?: A Little Help needed standing up from a chair using your arms (e.g., wheelchair or bedside chair)?: A Little Help needed to walk in hospital room?: A Little Help needed climbing 3-5 steps with a railing? : A Little 6 Click Score: 20    End of Session Equipment Utilized During Treatment: Gait  belt Activity Tolerance: Patient tolerated treatment well Patient left: in chair;with call bell/phone within reach;with chair alarm set Nurse Communication: Mobility status PT Visit Diagnosis: Unsteadiness on feet (R26.81);Difficulty in walking, not elsewhere classified (R26.2)    Time: 0223-3612 PT Time Calculation (min) (ACUTE ONLY): 45 min   Charges:   PT Evaluation $PT Eval Moderate Complexity: 1 Mod PT Treatments $Gait Training: 23-37 mins        Conni Slipper, PT, DPT Acute Rehabilitation Services Pager: [phone] Office: [phone]   Marylynn Pearson 07/27/2021, 10:11 AM

## 2021-07-28 MED ORDER — ONDANSETRON HCL 4 MG/2ML IJ SOLN
4.0000 mg | Freq: Four times a day (QID) | INTRAMUSCULAR | Status: DC | PRN
  Administered 2021-07-28: 4 mg via INTRAVENOUS
  Filled 2021-07-28: qty 2

## 2021-07-28 NOTE — Progress Notes (Signed)
Received orders for discharge to home. Medication information, instructions given to patient who acknowledges understanding.

## 2021-07-28 NOTE — Progress Notes (Signed)
Physical Therapy Treatment Patient Details Name: Tyler Alvarado MRN: [id number] DOB: Nov 13, 1945 Today's Date: 07/28/2021   History of Present Illness Pt is a 76 y/o male presented to ED with dizziness, unsteady gait, LOB, weakness and decreased sensation on the L. 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    PT Comments    Pt progressing towards physical therapy goals. Was able to perform transfers and ambulation with supervision to modified independence and no AD. Pt holding to therapist's arm for way finding due to low vision however no overt LOB noted throughout session. At times, posture is flexed however it is due to pt feeling his way around the area. LLE.LUE improved today however still with mild strength and coordination deficits. Will continue to follow and progress as able per POC.     Recommendations for follow up therapy are one component of a multi-disciplinary discharge planning process, led by the attending physician.  Recommendations may be updated based on patient status, additional functional criteria and insurance authorization.  Follow Up Recommendations  Home health PT     Assistance Recommended at Discharge Intermittent Supervision/Assistance  Patient can return home with the following A little help with walking and/or transfers;Assist for transportation;Help with stairs or ramp for entrance   Equipment Recommendations  None recommended by PT    Recommendations for Other Services       Precautions / Restrictions Precautions Precautions: Fall Precaution Comments: low vision Restrictions Weight Bearing Restrictions: No     Mobility  Bed Mobility Overal bed mobility: Needs Assistance Bed Mobility: Supine to Sit     Supine to sit: Supervision     General bed mobility comments: increased time and light supervision for safety however no assist required.     Transfers Overall transfer level: Modified independent Equipment used: None Transfers: Sit to/from Stand             General transfer comment: Pt cautious due to low vision but reaching appropriately for arm rests. Increased time without noted LOB. Pt stood from EOB and low toilet.    Ambulation/Gait Ambulation/Gait assistance: Supervision Gait Distance (Feet): 250 Feet Assistive device: 1 person hand held assist Gait Pattern/deviations: Step-through pattern, Decreased stride length, Drifts right/left, Trunk flexed Gait velocity: Decreased Gait velocity interpretation: 1.31 - 2.62 ft/sec, indicative of limited community ambulator   General Gait Details: Pt holding to therapist's arm for guidance around the unit due to low vision. Trunk slightly rotated to the R (towards therapist), however able to improve with cues. No overt LOB noted.   Stairs             Wheelchair Mobility    Modified Rankin (Stroke Patients Only) Modified Rankin (Stroke Patients Only) Pre-Morbid Rankin Score: Slight disability Modified Rankin: Moderately severe disability     Balance Overall balance assessment: Needs assistance Sitting-balance support: Feet supported Sitting balance-Leahy Scale: Good     Standing balance support: No upper extremity supported, During functional activity Standing balance-Leahy Scale: Fair                              Cognition Arousal/Alertness: Awake/alert Behavior During Therapy: WFL for tasks assessed/performed Overall Cognitive Status: Within Functional Limits for tasks assessed  General Comments: Pt has great insight to deficits and was able to recall stroke location and education provided by neurologist.        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 PT Goal Formulation: With patient Time For Goal Achievement: 08/03/21 Potential to Achieve Goals: Good Progress towards PT goals: Progressing toward goals    Frequency    Min 4X/week      PT Plan Current plan remains appropriate    Co-evaluation              AM-PAC PT "6 Clicks" Mobility   Outcome Measure  Help needed turning from your back to your side while in a flat bed without using bedrails?: None Help needed moving from lying on your back to sitting on the side of a flat bed without using bedrails?: None Help needed moving to and from a bed to a chair (including a wheelchair)?: A Little Help needed standing up from a chair using your arms (e.g., wheelchair or bedside chair)?: A Little Help needed to walk in hospital room?: A Little Help needed climbing 3-5 steps with a railing? : A Little 6 Click Score: 20    End of Session Equipment Utilized During Treatment: Gait belt Activity Tolerance: Patient tolerated treatment well Patient left: in chair;with call bell/phone within reach;with chair alarm set Nurse Communication: Mobility status PT Visit Diagnosis: Unsteadiness on feet (R26.81);Difficulty in walking, not elsewhere classified (R26.2)     Time: 6270-3500 PT Time Calculation (min) (ACUTE ONLY): 26 min  Charges:  $Gait Training: 23-37 mins                      Conni Slipper, PT, DPT Acute Rehabilitation Services Pager: [phone] Office: [phone]    Marylynn Pearson 07/28/2021, 10:43 AM

## 2021-07-28 NOTE — Progress Notes (Signed)
Occupational Therapy Treatment Patient Details Name: Tyler Alvarado MRN: [id number] DOB: 1946/01/29 Today's Date: 07/28/2021   History of present illness Pt is a 76 y/o male presented to ED with dizziness, unsteady gait, LOB, weakness and decreased sensation on the L. 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   OT comments  Patient received in recliner and eager to participate with OT. Discussed low vision strategies and patient was able to recall strategies he currently uses and color contrasting was recommended to assist with steps.  Patient ambulated to bathroom and was able to recall strategies to navigate room and was used rail for transfer.  Vision strategies further discussed. Acute OT to continue to follow.    Recommendations for follow up therapy are one component of a multi-disciplinary discharge planning process, led by the attending physician.  Recommendations may be updated based on patient status, additional functional criteria and insurance authorization.    Follow Up Recommendations  Home health OT    Assistance Recommended at Discharge    Patient can return home with the following  Assistance with cooking/housework;Assist for transportation   Equipment Recommendations  Tub/shower bench    Recommendations for Other Services      Precautions / Restrictions Precautions Precautions: Fall Precaution Comments: low vision Restrictions Weight Bearing Restrictions: No       Mobility Bed Mobility Overal bed mobility: Needs Assistance             General bed mobility comments: up in recliner    Transfers Overall transfer level: Modified independent Equipment used: None Transfers: Sit to/from Stand Sit to Stand: Supervision           General transfer comment: able to transfer to toilet with use of rail and aware of layout from previous visit     Balance Overall  balance assessment: Needs assistance Sitting-balance support: Feet supported Sitting balance-Leahy Scale: Good     Standing balance support: No upper extremity supported, During functional activity Standing balance-Leahy Scale: Fair Standing balance comment: able to stand at sink for grooming without support                           ADL either performed or assessed with clinical judgement   ADL Overall ADL's : Needs assistance/impaired     Grooming: Oral care;Supervision/safety;Standing Grooming Details (indicate cue type and reason): able to locate items on sink using low vision strategies                 Toilet Transfer: Min guard;Ambulation Toilet Transfer Details (indicate cue type and reason): able to recall techniques to locate bathroom/navigate room Toileting- Clothing Manipulation and Hygiene: Supervision/safety;Sitting/lateral lean         General ADL Comments: Patient able to locate bathroom with visual strategies    Extremity/Trunk Assessment Upper Extremity Assessment LUE Deficits / Details: globally 4/5 MMT. some numbness throughout the hand. thumb to finger coordination is slower & more deliberate on the L. Pt able to use funcitonally for grooming at the sink. LUE Sensation: decreased light touch LUE Coordination: decreased fine motor   Lower Extremity Assessment LLE Deficits / Details: Slight strength deficit compared to RLE. Overall pt with great strength and 5/5 in quads, hamstrings. 4+/5 in hip flexor and ankle DF compared to 5/5 in all muscle groups on the RLE. LLE Sensation: WNL (Pt denies sensation changes)   Cervical / Trunk Assessment Cervical /  Trunk Assessment: Normal    Vision       Perception     Praxis      Cognition Arousal/Alertness: Awake/alert Behavior During Therapy: WFL for tasks assessed/performed Overall Cognitive Status: Within Functional Limits for tasks assessed                                  General Comments: able it recall 3 low vision techniques        Exercises      Shoulder Instructions       General Comments discussed low vision strategies and patient was able to recall 3    Pertinent Vitals/ Pain       Pain Assessment Pain Assessment: No/denies pain  Home Living Family/patient expects to be discharged to:: Private residence Living Arrangements: Spouse/significant other Available Help at Discharge: Family;Available PRN/intermittently;Available 24 hours/day Type of Home: House Home Access: Stairs to enter [employer] of Steps: 5 Entrance Stairs-Rails: Right;Left Home Layout: Multi-level;Laundry or work area in Artist of Steps: 8   Bathroom Shower/Tub: Chief Strategy Officer: Handicapped height     Home Equipment: None (make-shift grab bar for shower)      Lives With: Alone    Prior Functioning/Environment              Frequency  Min 2X/week        Progress Toward Goals  OT Goals(current goals can now be found in the care plan section)  Progress towards OT goals: Progressing toward goals  Acute Rehab OT Goals Patient Stated Goal: go home OT Goal Formulation: With patient Time For Goal Achievement: 08/10/21 Potential to Achieve Goals: Good ADL Goals Pt Will Transfer to Toilet: Independently;ambulating Pt Will Perform Tub/Shower Transfer: with modified independence;ambulating;tub bench Additional ADL Goal #1: Pt will independently complete BADLs Additional ADL Goal #2: Pt will indep recall at least 3 low vision strategies to apply to the home setting  Plan Discharge plan remains appropriate    Co-evaluation                 AM-PAC OT "6 Clicks" Daily Activity     Outcome Measure   Help from another person eating meals?: None Help from another person taking care of personal grooming?: A Little Help from another person toileting, which includes using toliet, bedpan, or  urinal?: A Little Help from another person bathing (including washing, rinsing, drying)?: A Little Help from another person to put on and taking off regular upper body clothing?: None Help from another person to put on and taking off regular lower body clothing?: A Little 6 Click Score: 20    End of Session Equipment Utilized During Treatment: Gait belt  OT Visit Diagnosis: Unsteadiness on feet (R26.81);Other abnormalities of gait and mobility (R26.89);Muscle weakness (generalized) (M62.81);Pain;Hemiplegia and hemiparesis Hemiplegia - Right/Left: Left Hemiplegia - dominant/non-dominant: Non-Dominant Hemiplegia - caused by: Cerebral infarction   Activity Tolerance Patient tolerated treatment well   Patient Left in chair;with call bell/phone within reach;with chair alarm set   Nurse Communication Mobility status        Time: 9528-4132 OT Time Calculation (min): 33 min  Charges: OT General Charges $OT Visit: 1 Visit OT Treatments $Self Care/Home Management : 8-22 mins $Therapeutic Activity: 8-22 mins  Alfonse Flavors, OTA Acute Rehabilitation Services  Pager [phone] Office [phone]   Dewain Penning 07/28/2021, 11:51 AM

## 2021-07-28 NOTE — TOC Transition Note (Signed)
Transition of Care [facility]) - CM/SW Discharge Note   Patient Details  Name: Tyler Alvarado MRN: [id number] Date of Birth: 1945/08/16  [facility] CM/SW Contact:  Kermit Balo, RN Phone Number: 07/28/2021, 2:08 PM   Clinical Narrative:    Patient is discharging home with home health services through [facility]. Information on the AVS. 3 in 1 for home has been delivered to the room per [employer].  Pt has transport home.    Final next level of care: Home w Home Health Services Barriers to Discharge: No Barriers Identified   Patient Goals and CMS Choice   CMS Medicare.gov Compare Post Acute Care list provided to:: Patient Choice offered to / list presented to : Patient  Discharge Placement                       Discharge Plan and Services   Discharge Planning Services: CM Consult Post Acute Care Choice: Durable Medical Equipment, Home Health          DME Arranged: 3-N-1 DME Agency: [employer] Date DME Agency Contacted: 07/27/21   Representative spoke with at DME Agency: Velna Hatchet HH Arranged: PT, OT [facility] Agency: [facility] Date [facility] Agency Contacted: 07/28/21   Representative spoke with at [facility] Agency: Misty Stanley  Social Determinants of Health (SDOH) Interventions     Readmission Risk Interventions No flowsheet data found.

## 2021-07-28 NOTE — Progress Notes (Addendum)
STROKE TEAM PROGRESS NOTE   INTERVAL HISTORY Patient seen and assessed at bedside. Patient reports neurologically being back at his baseline. Patient reports doing well on CPAP.  Overnight and he was able to tolerate it.  He was randomized to the sleep smart study to medical treatment only.  No other complaints today.  Vitals:   07/28/21 0353 07/28/21 0728 07/28/21 0901 07/28/21 1146  BP: 140/83 (!) 148/100 105/89 121/84  Pulse: 63 74 72 80  Resp: 18 16 15 16   Temp: 97.7 F (36.5 C) 98.3 F (36.8 C)  (!) 97.5 F (36.4 C)  TempSrc:  Oral  Oral  SpO2: 98% 100% 100% 97%   CBC:  Recent Labs  Lab 07/25/21 1401 07/25/21 1403  WBC 6.1  --   NEUTROABS 3.5  --   HGB 13.9 15.6  HCT 43.3 46.0  MCV 92.3  --   PLT 248  --     Basic Metabolic Panel:  Recent Labs  Lab 07/25/21 1401 07/25/21 1403  NA 132* 139  K 3.6 3.7  CL 104 102  CO2 24  --   GLUCOSE 97 97  BUN 14 15  CREATININE 1.00 0.90  CALCIUM 8.5*  --     Lipid Panel:  Recent Labs  Lab 07/26/21 0324  CHOL 190  TRIG 51  HDL 43  CHOLHDL 4.4  VLDL 10  LDLCALC 137*    HgbA1c:  Recent Labs  Lab 07/26/21 0324  HGBA1C 6.0*    Urine Drug Screen: No results for input(s): LABOPIA, COCAINSCRNUR, LABBENZ, AMPHETMU, THCU, LABBARB in the last 168 hours.  Alcohol Level No results for input(s): ETH in the last 168 hours.  IMAGING past 24 hours No results found.  PHYSICAL EXAM  Temp:  [97.5 F (36.4 C)-98.7 F (37.1 C)] 97.5 F (36.4 C) (01/17 1146) Pulse Rate:  [63-87] 80 (01/17 1146) Resp:  [15-18] 16 (01/17 1146) BP: (105-148)/(83-100) 121/84 (01/17 1146) SpO2:  [95 %-100 %] 97 % (01/17 1146)  General -mildly obese elderly African-American male.  In no apparent distress.  Cardiovascular - Regular rhythm and rate.  Mental Status -  Level of arousal and orientation to time, place, and person were intact. Language including expression, naming, repetition, comprehension was assessed and found  intact. Attention span and concentration were normal. Recent and remote memory were intact. Fund of Knowledge was assessed and was intact.  Corneal opacity in left pupil. Right pupil round and reactive to light. Rhythmic pendular nystagmus bilaterally in all directions of gaze. Facial sensation intact bilaterally, face symmetric, tongue, and palate midline.   Motor Strength - The patients strength was normal in all extremities and pronator drift was absent.  Bulk was normal and fasciculations were absent.   Motor Tone - Muscle tone was assessed at the neck and appendages and was normal.  Reflexes - Weak cough reflex. No pathological reflexes otherwise noted.   Sensory - Light touch, temperature/pinprick were assessed and were symmetrical.    Coordination - The patient had normal movements in the hands and feet with no ataxia or dysmetria.  Tremor was absent.  Gait and Station - deferred.  NIH stroke scale 3.    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 male with history of HTN, congenital nystagmus, corneal scarring and keratoconus presenting with unsteady gait, loss of balance, left sided weakness and left sided decrease in sensation.   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 No acute hemorrhage.   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 Mild to moderate chronic microvascular ischemic changes. 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 Yes; Fluid consistency: Thin   No antithrombotic prior to  admission, now on aspirin 81 mg daily and clopidogrel 75 mg daily. Recommend ASA+Plavix for 3 months followed by ASA indefinitely Therapy recommendations:  home PT/OT Disposition:  pending  Obstructive Sleep Apnea -Trial of CPAP machine tonight  Hypertension Home meds: Norvasc 10 mg Stable Permissive hypertension (OK if < 220/120) but gradually normalize in 5-7 days Long-term BP goal normotensive  Hyperlipidemia Home meds:  none LDL 137, goal < 70 Started on lipitor 80 this admission Continue statin at discharge  Other Stroke Risk Factors Advanced Age >/= 15  Obesity,  BMI >/= 30 associated with increased stroke risk, recommend weight loss, diet and exercise as appropriate  Obstructive sleep apnea, patient participating in sleep smart stroke prevention study and randomized to medical treatment  Hospital day # 3  France Ravens, MD PGY1 Resdient I have personally obtained history,examined this patient, reviewed notes, independently viewed imaging studies, participated in medical decision making and plan of care.ROS completed by me personally and pertinent positives fully documented  I have made any additions or clarifications directly to the above note. Agree with note above.  Recommend aspirin and Plavix for 3 months followed by aspirin alone and aggressive risk factor modification.  Continue participation in the sleep smart stroke prevention study for sleep apnea patient has been randomized to medical treatment.  Follow-up in the stroke research clinic as per study protocol.  Discussed with Dr. Karleen Hampshire.  Greater than 50% time during this 35-minute visit was spent in counseling and coordination of care about his stroke and vertebral artery occlusion diagnosis of sleep apnea participation in sleep smart study and answering questions Antony Contras, MD Medical Director [facility] Pager: [phone] 07/28/2021 12:38 PM   To contact Stroke Continuity provider, please refer to  [email or web address] After hours, contact General Neurology

## 2021-07-28 NOTE — Care Management Important Message (Signed)
Important Message  Patient Details  Name: KRISTIN LAMAGNA MRN: [id number] Date of Birth: 1945/12/25   Medicare Important Message Given:  Yes     Dorena Bodo 07/28/2021, 12:58 PM

## 2021-07-29 NOTE — Discharge Summary (Signed)
Physician Discharge Summary  Tyler Alvarado:[id number] DOB: 07-28-1945 DOA: 07/25/2021  PCP: Leilani Able, MD  Admit date: 07/25/2021 Discharge date: 07/28/2021  Admitted From: Home.  Disposition:  Home.   Recommendations for Outpatient Follow-up:  Follow up with PCP in 1-2 weeks Please obtain BMP/CBC in one week Please follow up with Neurology as recommended.   Discharge Condition:stable.  CODE STATUS:Full code.  Diet recommendation: Heart Healthy  Brief/Interim Summary: Tyler Alvarado is a 76 y.o. male with medical history significant of  hypertension, keratoconus, congenital nystagmus, corneal scarring, presents to ED for dizziness started yesterday morning, along with unsteady gait, loss of balance, almost fell,  weakness on the left compared to the right side of the body, along with decreased sensation on the left side. He was found to have Age indeterminate ischemic changes within the bilateral frontal subcortical white matter, left parietal subcortical white matter, and inferior right cerebellar hemisphere.  He was admitted for stroke work up.   Discharge Diagnoses:  Principal Problem:   Stroke [facility]) Active Problems:   Essential hypertension   Hyperlipidemia  Acute stroke : MRI brain showed Suspected small acute infarct of the right ventral medulla. Mild to moderate chronic microvascular ischemic changes. 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 Neurology consulted , started on Aspirin 81 mg daily, plavix 75 mg daily for 3 months followed by aspirin indefinitely.  LDL is  137 , started him on lipitor.  Hemoglobin A1c is 6.  Therapy evaluations are recommending home health PT/OT.  Echocardiogram showed LVEF of 55%.  Recommend outpatient follow up with PCP on one week.Neurology follow up recommended.      Hypertension:  Well controlled. On amlodipine at home.       Hyperlipidemia:  LDL is 137, started on  Lipitor   OSA:  Trial of CPAP tonight.     Discharge Instructions  Discharge Instructions     Diet - low sodium heart healthy   Complete by: As directed    Diet - low sodium heart healthy   Complete by: As directed    Increase activity slowly   Complete by: As directed    Increase activity slowly   Complete by: As directed       Allergies as of 07/28/2021       Reactions   Pork-derived Products    Pt does not eat any pork        Medication List     STOP taking these medications    oxaprozin 600 MG tablet Commonly known as: Daypro       TAKE these medications    amLODipine 10 MG tablet Commonly known as: NORVASC Take 10 mg by mouth daily.   aspirin 81 MG EC tablet Take 1 tablet (81 mg total) by mouth daily. Swallow whole.   atorvastatin 80 MG tablet Commonly known as: LIPITOR Take 1 tablet (80 mg total) by mouth daily.   clopidogrel 75 MG tablet Commonly known as: PLAVIX Take 1 tablet (75 mg total) by mouth daily.        Follow-up Information     [facility]   Specialty: Home Health Services Why: The home health agency will contact you for the first home visit Contact information: [address] [place] [address] [phone]                Allergies  Allergen Reactions   Pork-Derived Products     Pt  does not eat any pork    Consultations: Neurology.    Procedures/Studies: CT HEAD WO CONTRAST ( )  Result Date: 07/25/2021 CLINICAL DATA:  Dizziness and left-sided weakness since 5 a.m. yesterday EXAM: CT HEAD WITHOUT CONTRAST TECHNIQUE: Contiguous axial images were obtained from the base of the skull through the vertex without intravenous contrast. RADIATION DOSE REDUCTION: This exam was performed according to the departmental dose-optimization program which includes automated exposure control, adjustment of the mA and/or kV according to patient size and/or use of iterative reconstruction  technique. COMPARISON:  None. FINDINGS: Brain: Subcortical white matter hypodensities are seen within the bilateral frontal regions and left parietal region, consistent with age-indeterminate small vessel ischemic change. Focal hypodensity inferior right cerebellar hemisphere consistent with age indeterminate cerebellar infarct. No other signs of acute infarct or hemorrhage. Lateral ventricles and midline structures are unremarkable. No acute extra-axial fluid collections. No mass effect. Vascular: No hyperdense vessel or unexpected calcification. Skull: Normal. Negative for fracture or focal lesion. Sinuses/Orbits: Postsurgical changes left globe, with scleral buckle and metallic clip identified. Paranasal sinuses are unremarkable. Other: None. IMPRESSION: 1. Age indeterminate ischemic changes within the bilateral frontal subcortical white matter, left parietal subcortical white matter, and inferior right cerebellar hemisphere. 2. No evidence of acute hemorrhage. 3. Postsurgical changes left globe. Electronically Signed   By: Sharlet Salina M.D.   On: 07/25/2021 15:31   MR ANGIO HEAD WO CONTRAST  Addendum Date: 07/25/2021   ADDENDUM REPORT: 07/25/2021 19:28 ADDENDUM: Contrast dose is 10 mL Gadavist Electronically Signed   By: Guadlupe Spanish M.D.   On: 07/25/2021 19:28   Result Date: 07/25/2021 CLINICAL DATA:  Stroke, follow up; neuro deficit, acute, stroke suspected EXAM: MRI HEAD WITHOUT CONTRAST MRA HEAD WITHOUT CONTRAST MRA NECK WITHOUT AND WITH CONTRAST TECHNIQUE: Multiplanar, multi-echo pulse sequences of the brain and surrounding structures were acquired without intravenous contrast. Angiographic images of the Circle of Willis were acquired using MRA technique without intravenous contrast. Angiographic images of the neck were acquired using MRA technique without and with intravenous contrast. Carotid stenosis measurements (when applicable) are obtained utilizing NASCET criteria, using the distal  internal carotid diameter as the denominator. COMPARISON:  None FINDINGS: MRI HEAD Brain: Small focus of reduced diffusion is present along the right ventral medulla (series 2, image 10). There is no evidence of intracranial hemorrhage. Prominent left cerebellar developmental 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 anomaly with some associated T2 hyperintensity that may reflect gliosis less likely edema from venous congestion. Patchy and confluent areas of T2 hyperintensity in the supratentorial white matter are nonspecific but may reflect mild to moderate chronic microvascular ischemic changes. Scattered prominent perivascular spaces and/or chronic small vessel infarcts. There is no intracranial mass, mass effect, or edema. There is no hydrocephalus or extra-axial fluid collection. Vascular: Major vessel flow voids at the skull base are preserved. Skull and upper cervical spine: Normal marrow signal is preserved. Sinuses/Orbits: Paranasal sinuses are aerated. Orbits are unremarkable. Other: Sella is unremarkable.  Mastoid air cells are clear. MRA HEAD Intracranial internal carotid arteries are patent with atherosclerotic irregularity. Middle and anterior cerebral arteries are patent. There is diminished flow related enhancement of the intracranial right vertebral artery. On the post-contrast neck imaging, there is irregularity of the intracranial vertebral artery with high-grade stenosis distally. Intracranial left vertebral arteries, basilar artery, posterior cerebral arteries are patent. Bilateral posterior communicating arteries are present. There is no significant stenosis or aneurysm. MRA NECK Common, internal, and external carotid arteries are patent. No hemodynamically significant  stenosis. Retropharyngeal course of the right ICA. Dominant left vertebral artery is patent. Right vertebral artery is patent. 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 Mild to  moderate chronic microvascular ischemic changes. 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 Electronically Signed: By: Guadlupe SpanishPraneil  Patel M.D. On: 07/25/2021 19:19   MR ANGIO NECK W WO CONTRAST  Addendum Date: 07/25/2021   ADDENDUM REPORT: 07/25/2021 19:28 ADDENDUM: Contrast dose is 10 mL Gadavist Electronically Signed   By: Guadlupe SpanishPraneil  Patel M.D.   On: 07/25/2021 19:28   Result Date: 07/25/2021 CLINICAL DATA:  Stroke, follow up; neuro deficit, acute, stroke suspected EXAM: MRI HEAD WITHOUT CONTRAST MRA HEAD WITHOUT CONTRAST MRA NECK WITHOUT AND WITH CONTRAST TECHNIQUE: Multiplanar, multi-echo pulse sequences of the brain and surrounding structures were acquired without intravenous contrast. Angiographic images of the Circle of Willis were acquired using MRA technique without intravenous contrast. Angiographic images of the neck were acquired using MRA technique without and with intravenous contrast. Carotid stenosis measurements (when applicable) are obtained utilizing NASCET criteria, using the distal internal carotid diameter as the denominator. COMPARISON:  None FINDINGS: MRI HEAD Brain: Small focus of reduced diffusion is present along the right ventral medulla (series 2, image 10). There is no evidence of intracranial hemorrhage. Prominent left cerebellar developmental 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 anomaly with some associated T2 hyperintensity that may reflect gliosis less likely edema from venous congestion. Patchy and confluent areas of T2 hyperintensity in the supratentorial white matter are nonspecific but may reflect mild to moderate chronic microvascular ischemic changes. Scattered prominent perivascular spaces and/or chronic small vessel infarcts. There is no intracranial mass, mass effect, or edema. There is no hydrocephalus or extra-axial fluid collection. Vascular: Major vessel flow voids at the skull base are preserved.  Skull and upper cervical spine: Normal marrow signal is preserved. Sinuses/Orbits: Paranasal sinuses are aerated. Orbits are unremarkable. Other: Sella is unremarkable.  Mastoid air cells are clear. MRA HEAD Intracranial internal carotid arteries are patent with atherosclerotic irregularity. Middle and anterior cerebral arteries are patent. There is diminished flow related enhancement of the intracranial right vertebral artery. On the post-contrast neck imaging, there is irregularity of the intracranial vertebral artery with high-grade stenosis distally. Intracranial left vertebral arteries, basilar artery, posterior cerebral arteries are patent. Bilateral posterior communicating arteries are present. There is no significant stenosis or aneurysm. MRA NECK Common, internal, and external carotid arteries are patent. No hemodynamically significant stenosis. Retropharyngeal course of the right ICA. Dominant left vertebral artery is patent. Right vertebral artery is patent. 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 Mild to moderate chronic microvascular ischemic changes. 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 Electronically Signed: By: Guadlupe SpanishPraneil  Patel M.D. On: 07/25/2021 19:19   MR BRAIN WO CONTRAST  Addendum Date: 07/25/2021   ADDENDUM REPORT: 07/25/2021 19:28 ADDENDUM: Contrast dose is 10 mL Gadavist Electronically Signed   By: Guadlupe SpanishPraneil  Patel M.D.   On: 07/25/2021 19:28   Result Date: 07/25/2021 CLINICAL DATA:  Stroke, follow up; neuro deficit, acute, stroke suspected EXAM: MRI HEAD WITHOUT CONTRAST MRA HEAD WITHOUT CONTRAST MRA NECK WITHOUT AND WITH CONTRAST TECHNIQUE: Multiplanar, multi-echo pulse sequences of the brain and surrounding structures were acquired without intravenous contrast. Angiographic images of the Circle of Willis were acquired using MRA technique without intravenous contrast. Angiographic images of the neck were  acquired using MRA technique without and with intravenous contrast. Carotid stenosis measurements (when applicable) are  obtained utilizing NASCET criteria, using the distal internal carotid diameter as the denominator. COMPARISON:  None FINDINGS: MRI HEAD Brain: Small focus of reduced diffusion is present along the right ventral medulla (series 2, image 10). There is no evidence of intracranial hemorrhage. Prominent left cerebellar developmental 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 anomaly with some associated T2 hyperintensity that may reflect gliosis less likely edema from venous congestion. Patchy and confluent areas of T2 hyperintensity in the supratentorial white matter are nonspecific but may reflect mild to moderate chronic microvascular ischemic changes. Scattered prominent perivascular spaces and/or chronic small vessel infarcts. There is no intracranial mass, mass effect, or edema. There is no hydrocephalus or extra-axial fluid collection. Vascular: Major vessel flow voids at the skull base are preserved. Skull and upper cervical spine: Normal marrow signal is preserved. Sinuses/Orbits: Paranasal sinuses are aerated. Orbits are unremarkable. Other: Sella is unremarkable.  Mastoid air cells are clear. MRA HEAD Intracranial internal carotid arteries are patent with atherosclerotic irregularity. Middle and anterior cerebral arteries are patent. There is diminished flow related enhancement of the intracranial right vertebral artery. On the post-contrast neck imaging, there is irregularity of the intracranial vertebral artery with high-grade stenosis distally. Intracranial left vertebral arteries, basilar artery, posterior cerebral arteries are patent. Bilateral posterior communicating arteries are present. There is no significant stenosis or aneurysm. MRA NECK Common, internal, and external carotid arteries are patent. No hemodynamically significant stenosis. Retropharyngeal course of  the right ICA. Dominant left vertebral artery is patent. Right vertebral artery is patent. 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 Mild to moderate chronic microvascular ischemic changes. 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 Electronically Signed: By: Guadlupe Spanish M.D. On: 07/25/2021 19:19   ECHOCARDIOGRAM COMPLETE  Result Date: 07/26/2021    ECHOCARDIOGRAM REPORT   Patient Name:   EMIEL KIELTY Date of Exam: 07/26/2021 Medical Rec #:  [id number]        Height:       67.0 in Accession #:    [id number]       Weight:       217.0 lb Date of Birth:  21-Feb-1946         BSA:          2.094 m Patient Age:    75 years         BP:           140/81 mmHg Patient Gender: M                HR:           71 bpm. Exam Location:  Inpatient Procedure: 2D Echo Indications:    stroke  History:        Patient has no prior history of Echocardiogram examinations.  Sonographer:    Delcie Roch RDCS Referring Phys: Herminio Heads  Sonographer Comments: Image acquisition challenging due to respiratory motion. IMPRESSIONS  1. Left ventricular ejection fraction, by estimation, is 55 to 60%. The left ventricle has normal function. The left ventricle has no regional wall motion abnormalities. There is mild concentric left ventricular hypertrophy. Left ventricular diastolic parameters are consistent with Grade I diastolic dysfunction (impaired relaxation).  2. Right ventricular systolic function is normal. The right ventricular size is normal. Tricuspid regurgitation signal is inadequate for assessing PA pressure.  3. The mitral valve is normal in structure. No evidence of mitral valve regurgitation. No evidence of mitral stenosis.  4. The aortic valve  is tricuspid. Aortic valve regurgitation is not visualized. No aortic stenosis is present.  5. The inferior vena cava is normal in size with greater than 50% respiratory variability, suggesting right  atrial pressure of 3 mmHg. Comparison(s): No prior Echocardiogram. FINDINGS  Left Ventricle: Left ventricular ejection fraction, by estimation, is 55 to 60%. The left ventricle has normal function. The left ventricle has no regional wall motion abnormalities. The left ventricular internal cavity size was normal in size. There is  mild concentric left ventricular hypertrophy. Left ventricular diastolic parameters are consistent with Grade I diastolic dysfunction (impaired relaxation). Right Ventricle: The right ventricular size is normal. No increase in right ventricular wall thickness. Right ventricular systolic function is normal. Tricuspid regurgitation signal is inadequate for assessing PA pressure. Left Atrium: Left atrial size was normal in size. Right Atrium: Right atrial size was normal in size. Pericardium: There is no evidence of pericardial effusion. Mitral Valve: The mitral valve is normal in structure. No evidence of mitral valve regurgitation. No evidence of mitral valve stenosis. Tricuspid Valve: The tricuspid valve is normal in structure. Tricuspid valve regurgitation is not demonstrated. No evidence of tricuspid stenosis. Aortic Valve: The aortic valve is tricuspid. Aortic valve regurgitation is not visualized. No aortic stenosis is present. Pulmonic Valve: The pulmonic valve was grossly normal. Pulmonic valve regurgitation is not visualized. No evidence of pulmonic stenosis. Aorta: The aortic root and ascending aorta are structurally normal, with no evidence of dilitation. Venous: The inferior vena cava is normal in size with greater than 50% respiratory variability, suggesting right atrial pressure of 3 mmHg. IAS/Shunts: No atrial level shunt detected by color flow Doppler.  LEFT VENTRICLE PLAX 2D LVIDd:         4.00 cm   Diastology LVIDs:         2.70 cm   LV e' medial:    8.81 cm/s LV PW:         1.20 cm   LV E/e' medial:  6.5 LV IVS:        1.10 cm   LV e' lateral:   10.00 cm/s LVOT diam:      2.00 cm   LV E/e' lateral: 5.7 LV SV:         61 LV SV Index:   29 LVOT Area:     3.14 cm  RIGHT VENTRICLE             IVC RV S prime:     14.40 cm/s  IVC diam: 1.50 cm TAPSE (M-mode): 1.8 cm LEFT ATRIUM             Index        RIGHT ATRIUM           Index LA diam:        3.60 cm 1.72 cm/m   RA Area:     12.20 cm LA Vol (A2C):   33.4 ml 15.95 ml/m  RA Volume:   27.30 ml  13.04 ml/m LA Vol (A4C):   41.3 ml 19.73 ml/m LA Biplane Vol: 40.3 ml 19.25 ml/m  AORTIC VALVE LVOT Vmax:   99.50 cm/s LVOT Vmean:  65.600 cm/s LVOT VTI:    0.194 m  AORTA Ao Root diam: 2.90 cm Ao Asc diam:  3.00 cm MITRAL VALVE MV Area (PHT): 2.66 cm    SHUNTS MV Decel Time: 285 msec    Systemic VTI:  0.19 m MV E velocity: 57.40 cm/s  Systemic Diam: 2.00 cm MV A velocity: 73.80  cm/s MV E/A ratio:  0.78 Riley Lam MD Electronically signed by Riley Lam MD Signature Date/Time: 07/26/2021/12:47:17 PM    Final       Subjective: No new complaints.   Discharge Exam: Vitals:   07/28/21 1146 07/28/21 1200  BP: 121/84   Pulse: 80   Resp: 16   Temp: (!) 97.5 F (36.4 C)   SpO2: 97% 99%   Vitals:   07/28/21 0800 07/28/21 0901 07/28/21 1146 07/28/21 1200  BP:  105/89 121/84   Pulse:  72 80   Resp: 16 15 16    Temp:   (!) 97.5 F (36.4 C)   TempSrc:   Oral   SpO2: 100% 100% 97% 99%    General: Pt is alert, awake, not in acute distress Cardiovascular: RRR, S1/S2 +, no rubs, no gallops Respiratory: CTA bilaterally, no wheezing, no rhonchi Abdominal: Soft, NT, ND, bowel sounds + Extremities: no edema, no cyanosis    The results of significant diagnostics from this hospitalization (including imaging, microbiology, ancillary and laboratory) are listed below for reference.     Microbiology: Recent Results (from the past 240 hour(s))  Resp Panel by RT-PCR (Flu A&B, Covid) Nasopharyngeal Swab     Status: None   Collection Time: 07/25/21  6:21 PM   Specimen: Nasopharyngeal Swab; Nasopharyngeal(NP)  swabs in vial transport medium  Result Value Ref Range Status   SARS Coronavirus 2 by RT PCR NEGATIVE NEGATIVE Final    Comment: (NOTE) SARS-CoV-2 target nucleic acids are NOT DETECTED.  The SARS-CoV-2 RNA is generally detectable in upper respiratory specimens during the acute phase of infection. The lowest concentration of SARS-CoV-2 viral copies this assay can detect is 138 copies/mL. A negative result does not preclude SARS-Cov-2 infection and should not be used as the sole basis for treatment or other patient management decisions. A negative result may occur with  improper specimen collection/handling, submission of specimen other than nasopharyngeal swab, presence of viral mutation(s) within the areas targeted by this assay, and inadequate number of viral copies(<138 copies/mL). A negative result must be combined with clinical observations, patient history, and epidemiological information. The expected result is Negative.  Fact Sheet for Patients:  BloggerCourse.com  Fact Sheet for Healthcare Providers:  SeriousBroker.it  This test is no t yet approved or cleared by the [place] FDA and  has been authorized for detection and/or diagnosis of SARS-CoV-2 by FDA under an Emergency Use Authorization (EUA). This EUA will remain  in effect (meaning this test can be used) for the duration of the COVID-19 declaration under Section 564(b)(1) of the Act, 21 U.S.C.section 360bbb-3(b)(1), unless the authorization is terminated  or revoked sooner.       Influenza A by PCR NEGATIVE NEGATIVE Final   Influenza B by PCR NEGATIVE NEGATIVE Final    Comment: (NOTE) The Xpert Xpress SARS-CoV-2/FLU/RSV plus assay is intended as an aid in the diagnosis of influenza from Nasopharyngeal swab specimens and should not be used as a sole basis for treatment. Nasal washings and aspirates are unacceptable for Xpert Xpress  SARS-CoV-2/FLU/RSV testing.  Fact Sheet for Patients: BloggerCourse.com  Fact Sheet for Healthcare Providers: SeriousBroker.it  This test is not yet approved or cleared by the [place] FDA and has been authorized for detection and/or diagnosis of SARS-CoV-2 by FDA under an Emergency Use Authorization (EUA). This EUA will remain in effect (meaning this test can be used) for the duration of the COVID-19 declaration under Section 564(b)(1) of the Act, 21 U.S.C. section 360bbb-3(b)(1), unless  the authorization is terminated or revoked.  Performed at [facility], [address], [place], [place] [address]      Labs: BNP (last 3 results) No results for input(s): BNP in the last 8760 hours. Basic Metabolic Panel: Recent Labs  Lab 07/25/21 1401 07/25/21 1403  NA 132* 139  K 3.6 3.7  CL 104 102  CO2 24  --   GLUCOSE 97 97  BUN 14 15  CREATININE 1.00 0.90  CALCIUM 8.5*  --    Liver Function Tests: Recent Labs  Lab 07/25/21 1401  AST 17  ALT 21  ALKPHOS 78  BILITOT 0.7  PROT 8.5*  ALBUMIN 3.6   No results for input(s): LIPASE, AMYLASE in the last 168 hours. No results for input(s): AMMONIA in the last 168 hours. CBC: Recent Labs  Lab 07/25/21 1401 07/25/21 1403  WBC 6.1  --   NEUTROABS 3.5  --   HGB 13.9 15.6  HCT 43.3 46.0  MCV 92.3  --   PLT 248  --    Cardiac Enzymes: No results for input(s): CKTOTAL, CKMB, CKMBINDEX, TROPONINI in the last 168 hours. BNP: Invalid input(s): POCBNP CBG: No results for input(s): GLUCAP in the last 168 hours. D-Dimer No results for input(s): DDIMER in the last 72 hours. Hgb A1c No results for input(s): HGBA1C in the last 72 hours. Lipid Profile No results for input(s): CHOL, HDL, LDLCALC, TRIG, CHOLHDL, LDLDIRECT in the last 72 hours. Thyroid function studies No results for input(s): TSH, T4TOTAL, T3FREE, THYROIDAB in the last 72 hours.  Invalid input(s):  FREET3 Anemia work up No results for input(s): VITAMINB12, FOLATE, FERRITIN, TIBC, IRON, RETICCTPCT in the last 72 hours. Urinalysis No results found for: COLORURINE, APPEARANCEUR, LABSPEC, PHURINE, GLUCOSEU, HGBUR, BILIRUBINUR, KETONESUR, PROTEINUR, UROBILINOGEN, NITRITE, LEUKOCYTESUR Sepsis Labs Invalid input(s): PROCALCITONIN,  WBC,  LACTICIDVEN Microbiology Recent Results (from the past 240 hour(s))  Resp Panel by RT-PCR (Flu A&B, Covid) Nasopharyngeal Swab     Status: None   Collection Time: 07/25/21  6:21 PM   Specimen: Nasopharyngeal Swab; Nasopharyngeal(NP) swabs in vial transport medium  Result Value Ref Range Status   SARS Coronavirus 2 by RT PCR NEGATIVE NEGATIVE Final    Comment: (NOTE) SARS-CoV-2 target nucleic acids are NOT DETECTED.  The SARS-CoV-2 RNA is generally detectable in upper respiratory specimens during the acute phase of infection. The lowest concentration of SARS-CoV-2 viral copies this assay can detect is 138 copies/mL. A negative result does not preclude SARS-Cov-2 infection and should not be used as the sole basis for treatment or other patient management decisions. A negative result may occur with  improper specimen collection/handling, submission of specimen other than nasopharyngeal swab, presence of viral mutation(s) within the areas targeted by this assay, and inadequate number of viral copies(<138 copies/mL). A negative result must be combined with clinical observations, patient history, and epidemiological information. The expected result is Negative.  Fact Sheet for Patients:  BloggerCourse.com  Fact Sheet for Healthcare Providers:  SeriousBroker.it  This test is no t yet approved or cleared by the [place] FDA and  has been authorized for detection and/or diagnosis of SARS-CoV-2 by FDA under an Emergency Use Authorization (EUA). This EUA will remain  in effect (meaning this test can be  used) for the duration of the COVID-19 declaration under Section 564(b)(1) of the Act, 21 U.S.C.section 360bbb-3(b)(1), unless the authorization is terminated  or revoked sooner.       Influenza A by PCR NEGATIVE NEGATIVE Final  Influenza B by PCR NEGATIVE NEGATIVE Final    Comment: (NOTE) The Xpert Xpress SARS-CoV-2/FLU/RSV plus assay is intended as an aid in the diagnosis of influenza from Nasopharyngeal swab specimens and should not be used as a sole basis for treatment. Nasal washings and aspirates are unacceptable for Xpert Xpress SARS-CoV-2/FLU/RSV testing.  Fact Sheet for Patients: BloggerCourse.comhttps://www.fda.gov/media/152166/download  Fact Sheet for Healthcare Providers: SeriousBroker.ithttps://www.fda.gov/media/152162/download  This test is not yet approved or cleared by the [place] FDA and has been authorized for detection and/or diagnosis of SARS-CoV-2 by FDA under an Emergency Use Authorization (EUA). This EUA will remain in effect (meaning this test can be used) for the duration of the COVID-19 declaration under Section 564(b)(1) of the Act, 21 U.S.C. section 360bbb-3(b)(1), unless the authorization is terminated or revoked.  Performed at [facility], [address] [place], [place] [address]      Time coordinating discharge: 42 minutes.   SIGNED:   Kathlen ModyVijaya Efraim Vanallen, MD  [facility]

## 2023-02-09 ENCOUNTER — Emergency Department (HOSPITAL_COMMUNITY): Payer: Medicare Other

## 2023-02-09 ENCOUNTER — Other Ambulatory Visit: Payer: Self-pay

## 2023-02-09 ENCOUNTER — Encounter (HOSPITAL_COMMUNITY): Payer: Self-pay

## 2023-02-09 ENCOUNTER — Inpatient Hospital Stay (HOSPITAL_COMMUNITY)
Admission: EM | Admit: 2023-02-09 | Discharge: 2023-02-16 | Disposition: A | Discharge status: Skilled Nursing Facility | Payer: Medicare Other | Attending: Internal Medicine | Admitting: Internal Medicine

## 2023-02-09 DIAGNOSIS — Z79899 Other long term (current) drug therapy: Secondary | ICD-10-CM

## 2023-02-09 DIAGNOSIS — M545 Low back pain, unspecified: Secondary | ICD-10-CM | POA: Diagnosis present

## 2023-02-09 DIAGNOSIS — G8929 Other chronic pain: Secondary | ICD-10-CM | POA: Diagnosis present

## 2023-02-09 DIAGNOSIS — A4151 Sepsis due to Escherichia coli [E. coli]: Principal | ICD-10-CM | POA: Diagnosis present

## 2023-02-09 DIAGNOSIS — E785 Hyperlipidemia, unspecified: Secondary | ICD-10-CM | POA: Diagnosis present

## 2023-02-09 DIAGNOSIS — Z7902 Long term (current) use of antithrombotics/antiplatelets: Secondary | ICD-10-CM

## 2023-02-09 DIAGNOSIS — E871 Hypo-osmolality and hyponatremia: Secondary | ICD-10-CM | POA: Diagnosis present

## 2023-02-09 DIAGNOSIS — K5900 Constipation, unspecified: Secondary | ICD-10-CM | POA: Diagnosis present

## 2023-02-09 DIAGNOSIS — N12 Tubulo-interstitial nephritis, not specified as acute or chronic: Secondary | ICD-10-CM

## 2023-02-09 DIAGNOSIS — H18609 Keratoconus, unspecified, unspecified eye: Secondary | ICD-10-CM | POA: Diagnosis present

## 2023-02-09 DIAGNOSIS — N390 Urinary tract infection, site not specified: Secondary | ICD-10-CM | POA: Diagnosis not present

## 2023-02-09 DIAGNOSIS — A419 Sepsis, unspecified organism: Secondary | ICD-10-CM

## 2023-02-09 DIAGNOSIS — I10 Essential (primary) hypertension: Secondary | ICD-10-CM | POA: Diagnosis present

## 2023-02-09 DIAGNOSIS — Z8673 Personal history of transient ischemic attack (TIA), and cerebral infarction without residual deficits: Secondary | ICD-10-CM

## 2023-02-09 DIAGNOSIS — Z7982 Long term (current) use of aspirin: Secondary | ICD-10-CM

## 2023-02-09 DIAGNOSIS — N10 Acute tubulo-interstitial nephritis: Secondary | ICD-10-CM | POA: Diagnosis present

## 2023-02-09 DIAGNOSIS — D509 Iron deficiency anemia, unspecified: Secondary | ICD-10-CM

## 2023-02-09 DIAGNOSIS — U071 COVID-19: Secondary | ICD-10-CM | POA: Diagnosis not present

## 2023-02-09 DIAGNOSIS — Z91014 Allergy to mammalian meats: Secondary | ICD-10-CM

## 2023-02-09 HISTORY — DX: Cerebral infarction, unspecified: I63.9

## 2023-02-09 LAB — CBC WITH DIFFERENTIAL/PLATELET
Abs Immature Granulocytes: 0.03 10*3/uL (ref 0.00–0.07)
Basophils Absolute: 0 10*3/uL (ref 0.0–0.1)
Basophils Relative: 0 %
Eosinophils Absolute: 0 10*3/uL (ref 0.0–0.5)
Eosinophils Relative: 0 %
HCT: 36.4 % — ABNORMAL LOW (ref 39.0–52.0)
Hemoglobin: 11.7 g/dL — ABNORMAL LOW (ref 13.0–17.0)
Immature Granulocytes: 1 %
Lymphocytes Relative: 14 %
Lymphs Abs: 0.6 10*3/uL — ABNORMAL LOW (ref 0.7–4.0)
MCH: 28.7 pg (ref 26.0–34.0)
MCHC: 32.1 g/dL (ref 30.0–36.0)
MCV: 89.4 fL (ref 80.0–100.0)
Monocytes Absolute: 0.8 10*3/uL (ref 0.1–1.0)
Monocytes Relative: 16 %
Neutro Abs: 3.3 10*3/uL (ref 1.7–7.7)
Neutrophils Relative %: 69 %
Platelets: 212 10*3/uL (ref 150–400)
RBC: 4.07 MIL/uL — ABNORMAL LOW (ref 4.22–5.81)
RDW: 15.3 % (ref 11.5–15.5)
WBC: 4.7 10*3/uL (ref 4.0–10.5)
nRBC: 0 % (ref 0.0–0.2)

## 2023-02-09 LAB — RESP PANEL BY RT-PCR (RSV, FLU A&B, COVID)  RVPGX2
Influenza A by PCR: NEGATIVE
Influenza B by PCR: NEGATIVE
Resp Syncytial Virus by PCR: NEGATIVE
SARS Coronavirus 2 by RT PCR: POSITIVE — AB

## 2023-02-09 LAB — URINALYSIS, ROUTINE W REFLEX MICROSCOPIC
Bilirubin Urine: NEGATIVE
Glucose, UA: NEGATIVE mg/dL
Hgb urine dipstick: NEGATIVE
Ketones, ur: 5 mg/dL — AB
Nitrite: POSITIVE — AB
Protein, ur: NEGATIVE mg/dL
Specific Gravity, Urine: 1.017 (ref 1.005–1.030)
WBC, UA: >50 WBC/hpf (ref 0–5)
pH: 6 (ref 5.0–8.0)

## 2023-02-09 LAB — BASIC METABOLIC PANEL
Anion gap: 9 (ref 5–15)
BUN: 12 mg/dL (ref 8–23)
CO2: 24 mmol/L (ref 22–32)
Calcium: 8.6 mg/dL — ABNORMAL LOW (ref 8.9–10.3)
Chloride: 99 mmol/L (ref 98–111)
Creatinine, Ser: 0.89 mg/dL (ref 0.61–1.24)
GFR, Estimated: >60 mL/min (ref >60)
Glucose, Bld: 88 mg/dL (ref 70–99)
Potassium: 3.8 mmol/L (ref 3.5–5.1)
Sodium: 132 mmol/L — ABNORMAL LOW (ref 135–145)

## 2023-02-09 LAB — C-REACTIVE PROTEIN: CRP: 1.8 mg/dL — ABNORMAL HIGH (ref <1.0)

## 2023-02-09 LAB — I-STAT CG4 LACTIC ACID, ED: Lactic Acid, Venous: 0.6 mmol/L (ref 0.5–1.9)

## 2023-02-09 LAB — SEDIMENTATION RATE: Sed Rate: 56 mm/hr — ABNORMAL HIGH (ref 0–16)

## 2023-02-09 MED ORDER — ACETAMINOPHEN 325 MG PO TABS
650.0000 mg | ORAL_TABLET | Freq: Once | ORAL | Status: AC
  Administered 2023-02-09: 650 mg via ORAL
  Filled 2023-02-09: qty 2

## 2023-02-09 MED ORDER — LIDOCAINE 5 % EX PTCH
1.0000 | MEDICATED_PATCH | Freq: Once | CUTANEOUS | Status: AC
  Administered 2023-02-09: 1 via TRANSDERMAL
  Filled 2023-02-09: qty 1

## 2023-02-09 MED ORDER — AMLODIPINE BESYLATE 10 MG PO TABS
10.0000 mg | ORAL_TABLET | Freq: Every day | ORAL | Status: DC
  Administered 2023-02-10 – 2023-02-11 (×2): 10 mg via ORAL
  Filled 2023-02-09 (×2): qty 1

## 2023-02-09 MED ORDER — CLOPIDOGREL BISULFATE 75 MG PO TABS: 75.0000 mg | ORAL_TABLET | Freq: Every day | ORAL | Status: DC

## 2023-02-09 MED ORDER — ACETAMINOPHEN 325 MG PO TABS
650.0000 mg | ORAL_TABLET | Freq: Four times a day (QID) | ORAL | Status: DC | PRN
  Administered 2023-02-09 – 2023-02-13 (×4): 650 mg via ORAL
  Filled 2023-02-09 (×5): qty 2

## 2023-02-09 MED ORDER — ASPIRIN 81 MG PO TBEC
81.0000 mg | DELAYED_RELEASE_TABLET | Freq: Every day | ORAL | Status: DC
  Administered 2023-02-09 – 2023-02-16 (×8): 81 mg via ORAL
  Filled 2023-02-09 (×8): qty 1

## 2023-02-09 MED ORDER — SODIUM CHLORIDE 0.9 % IV SOLN
1.0000 g | Freq: Once | INTRAVENOUS | Status: AC
  Administered 2023-02-09: 1 g via INTRAVENOUS
  Filled 2023-02-09: qty 10

## 2023-02-09 MED ORDER — ORAL CARE MOUTH RINSE: 15.0000 mL | OROMUCOSAL | Status: DC | PRN

## 2023-02-09 MED ORDER — METHOCARBAMOL 500 MG PO TABS
500.0000 mg | ORAL_TABLET | Freq: Three times a day (TID) | ORAL | Status: DC | PRN
  Administered 2023-02-09 – 2023-02-14 (×5): 500 mg via ORAL
  Filled 2023-02-09 (×5): qty 1

## 2023-02-09 MED ORDER — SODIUM CHLORIDE 0.9 % IV SOLN
1.0000 g | INTRAVENOUS | Status: DC
  Administered 2023-02-10 – 2023-02-11 (×2): 1 g via INTRAVENOUS
  Filled 2023-02-09 (×2): qty 10

## 2023-02-09 MED ORDER — KETOROLAC TROMETHAMINE 15 MG/ML IJ SOLN
15.0000 mg | Freq: Once | INTRAMUSCULAR | Status: AC
  Administered 2023-02-09: 15 mg via INTRAVENOUS
  Filled 2023-02-09: qty 1

## 2023-02-09 MED ORDER — RIVAROXABAN 10 MG PO TABS
10.0000 mg | ORAL_TABLET | Freq: Every day | ORAL | Status: DC
  Administered 2023-02-09 – 2023-02-16 (×8): 10 mg via ORAL
  Filled 2023-02-09 (×8): qty 1

## 2023-02-09 MED ORDER — ATORVASTATIN CALCIUM 40 MG PO TABS: 80.0000 mg | ORAL_TABLET | Freq: Every day | ORAL | Status: DC

## 2023-02-09 MED ORDER — MORPHINE SULFATE (PF) 2 MG/ML IV SOLN
2.0000 mg | Freq: Once | INTRAVENOUS | Status: AC
  Administered 2023-02-09: 2 mg via INTRAVENOUS
  Filled 2023-02-09: qty 1

## 2023-02-09 MED ORDER — METHOCARBAMOL 500 MG PO TABS
500.0000 mg | ORAL_TABLET | Freq: Once | ORAL | Status: AC
  Administered 2023-02-09: 500 mg via ORAL
  Filled 2023-02-09: qty 1

## 2023-02-09 NOTE — ED Triage Notes (Signed)
Pt c/o lower back painxmo. Pt c/o generalized joint painxmo. Pt states if he straightens his right arm it gets numbxmo int. Pt states his right leg won't move well and it gets numbxmo.
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
## 2023-02-09 NOTE — Progress Notes (Signed)
MEWS Progress Note  Patient Details Name: OLUWATOBI KINDSCHI MRN: [id number] DOB: June 10, 1946 Today's Date: 02/09/2023   MEWS Flowsheet Documentation:  Assess: MEWS Score Temp: (!) 100.5 F (38.1 C) BP: (!) 161/84 MAP (mmHg): 107 Pulse Rate: (!) 106 Resp: 20 Level of Consciousness: Alert SpO2: 93 % O2 Device: Room Air Assess: MEWS Score MEWS Temp: 1 MEWS Systolic: 0 MEWS Pulse: 1 MEWS RR: 0 MEWS LOC: 0 MEWS Score: 2 MEWS Score Color: Yellow Assess: SIRS CRITERIA SIRS Temperature : 0 SIRS Respirations : 0 SIRS Pulse: 1 SIRS WBC: 0 SIRS Score Sum : 1 Assess: if the MEWS score is Yellow or Red Were vital signs accurate and taken at a resting state?: Yes Does the patient meet 2 or more of the SIRS criteria?: No MEWS guidelines implemented : Yes, yellow Treat MEWS Interventions: Considered administering scheduled or prn medications/treatments as ordered Take Vital Signs Increase Vital Sign Frequency : Yellow: Q2hr x1, continue Q4hrs until patient remains green for 12hrs Escalate MEWS: Escalate: Yellow: Discuss with charge nurse and consider notifying provider and/or RRT        Luverne Farone Shary Key 02/09/2023, 10:33 PM

## 2023-02-09 NOTE — ED Notes (Signed)
ED TO INPATIENT HANDOFF REPORT  ED Nurse Name and Phone #: Christean Leaf [phone]  S Name/Age/Gender Tyler Alvarado 77 y.o. male Room/Bed: H018C/H018C  Code Status   Code Status: Full Code  Home/SNF/Other Home Patient oriented to: self, place, time, and situation Is this baseline? Yes   Triage Complete: Triage complete  Chief Complaint COVID-19 [U07.1]  Triage Note Pt c/o lower back painxmo. Pt c/o generalized joint painxmo. Pt states if he straightens his right arm it gets numbxmo int. Pt states his right leg won't move well and it gets numbxmo.   Allergies Allergies  Allergen Reactions   Pork-Derived Products Other (See Comments)    Pt does not consume pork products    Level of Care/Admitting Diagnosis ED Disposition     ED Disposition  Admit   Condition  --   Comment  Hospital Area: [facility] [[id number]]  Level of Care: Telemetry Medical [104]  May place patient in observation at [facility] or [facility] if equivalent level of care is available:: Yes  Covid Evaluation: Confirmed COVID Positive  Diagnosis: COVID-19 [[id number]]  Admitting Physician: Gust Rung [[id number]]  Attending Physician: Gust Rung [[id number]]          B Medical/Surgery History Past Medical History:  Diagnosis Date   Hypertension    Stroke [facility])    Past Surgical History:  Procedure Laterality Date   EYE SURGERY       A IV Location/Drains/Wounds Patient Lines/Drains/Airways Status     Active Line/Drains/Airways     Name Placement date Placement time Site Days   Peripheral IV 02/09/23 20 G 1" Left Antecubital 02/09/23  1319  Antecubital  less than 1            Intake/Output Last 24 hours No intake or output data in the 24 hours ending 02/09/23 2027  Labs/Imaging Results for orders placed or performed during the hospital encounter of 02/09/23 (from the past 48 hour(s))  Basic metabolic panel     Status: Abnormal   Collection Time: 02/09/23  10:43 AM  Result Value Ref Range   Sodium 132 (L) 135 - 145 mmol/L   Potassium 3.8 3.5 - 5.1 mmol/L   Chloride 99 98 - 111 mmol/L   CO2 24 22 - 32 mmol/L   Glucose, Bld 88 70 - 99 mg/dL    Comment: Glucose reference range applies only to samples taken after fasting for at least 8 hours.   BUN 12 8 - 23 mg/dL   Creatinine, Ser 9.62 0.61 - 1.24 mg/dL   Calcium 8.6 (L) 8.9 - 10.3 mg/dL   GFR, Estimated >95 >28 mL/min    Comment: (NOTE) Calculated using the CKD-EPI Creatinine Equation (2021)    Anion gap 9 5 - 15    Comment: Performed at [facility], [address], [place], [place] [address]  CBC with Differential     Status: Abnormal   Collection Time: 02/09/23 10:43 AM  Result Value Ref Range   WBC 4.7 4.0 - 10.5 K/uL   RBC 4.07 (L) 4.22 - 5.81 MIL/uL   Hemoglobin 11.7 (L) 13.0 - 17.0 g/dL   HCT 40.1 (L) 02.7 - 25.3 %   MCV 89.4 80.0 - 100.0 fL   MCH 28.7 26.0 - 34.0 pg   MCHC 32.1 30.0 - 36.0 g/dL   RDW 66.4 40.3 - 47.4 %   Platelets 212 150 - 400 K/uL   nRBC 0.0 0.0 - 0.2 %  Neutrophils Relative % 69 %   Neutro Abs 3.3 1.7 - 7.7 K/uL   Lymphocytes Relative 14 %   Lymphs Abs 0.6 (L) 0.7 - 4.0 K/uL   Monocytes Relative 16 %   Monocytes Absolute 0.8 0.1 - 1.0 K/uL   Eosinophils Relative 0 %   Eosinophils Absolute 0.0 0.0 - 0.5 K/uL   Basophils Relative 0 %   Basophils Absolute 0.0 0.0 - 0.1 K/uL   Immature Granulocytes 1 %   Abs Immature Granulocytes 0.03 0.00 - 0.07 K/uL    Comment: Performed at [facility], [address] [place], [place] [address]  Resp panel by RT-PCR (RSV, Flu A&B, Covid) Anterior Nasal Swab     Status: Abnormal   Collection Time: 02/09/23 12:47 PM   Specimen: Anterior Nasal Swab  Result Value Ref Range   SARS Coronavirus 2 by RT PCR POSITIVE (A) NEGATIVE   Influenza A by PCR NEGATIVE NEGATIVE   Influenza B by PCR NEGATIVE NEGATIVE    Comment: (NOTE) The Xpert Xpress SARS-CoV-2/FLU/RSV plus assay is intended as an aid in the  diagnosis of influenza from Nasopharyngeal swab specimens and should not be used as a sole basis for treatment. Nasal washings and aspirates are unacceptable for Xpert Xpress SARS-CoV-2/FLU/RSV testing.  Fact Sheet for Patients: BloggerCourse.com  Fact Sheet for Healthcare Providers: SeriousBroker.it  This test is not yet approved or cleared by the [place] FDA and has been authorized for detection and/or diagnosis of SARS-CoV-2 by FDA under an Emergency Use Authorization (EUA). This EUA will remain in effect (meaning this test can be used) for the duration of the COVID-19 declaration under Section 564(b)(1) of the Act, 21 U.S.C. section 360bbb-3(b)(1), unless the authorization is terminated or revoked.     Resp Syncytial Virus by PCR NEGATIVE NEGATIVE    Comment: (NOTE) Fact Sheet for Patients: BloggerCourse.com  Fact Sheet for Healthcare Providers: SeriousBroker.it  This test is not yet approved or cleared by the [place] FDA and has been authorized for detection and/or diagnosis of SARS-CoV-2 by FDA under an Emergency Use Authorization (EUA). This EUA will remain in effect (meaning this test can be used) for the duration of the COVID-19 declaration under Section 564(b)(1) of the Act, 21 U.S.C. section 360bbb-3(b)(1), unless the authorization is terminated or revoked.  Performed at [facility], [address] [place], [place] [address]   Sedimentation rate     Status: Abnormal   Collection Time: 02/09/23 12:48 PM  Result Value Ref Range   Sed Rate 56 (H) 0 - 16 mm/hr    Comment: Performed at [facility], [address], [place], [place] [address]  C-reactive protein     Status: Abnormal   Collection Time: 02/09/23 12:48 PM  Result Value Ref Range   CRP 1.8 (H) <1.0 mg/dL    Comment: Performed at [facility], [address],  [place], [place] [address]  Urinalysis, Routine w reflex microscopic -Urine, Clean Catch     Status: Abnormal   Collection Time: 02/09/23  2:19 PM  Result Value Ref Range   Color, Urine YELLOW YELLOW   APPearance HAZY (A) CLEAR   Specific Gravity, Urine 1.017 1.005 - 1.030   pH 6.0 5.0 - 8.0   Glucose, UA NEGATIVE NEGATIVE mg/dL   Hgb urine dipstick NEGATIVE NEGATIVE   Bilirubin Urine NEGATIVE NEGATIVE   Ketones, ur 5 (A) NEGATIVE mg/dL   Protein, ur NEGATIVE NEGATIVE mg/dL   Nitrite POSITIVE (  A) NEGATIVE   Leukocytes,Ua LARGE (A) NEGATIVE   RBC / HPF 6-10 0 - 5 RBC/hpf   WBC, UA >50 0 - 5 WBC/hpf   Bacteria, UA MANY (A) NONE SEEN   Squamous Epithelial / HPF 0-5 0 - 5 /HPF   Mucus PRESENT     Comment: Performed at [facility], [address], [place], [place] [address]  I-Stat CG4 Lactic Acid     Status: None   Collection Time: 02/09/23  3:39 PM  Result Value Ref Range   Lactic Acid, Venous 0.6 0.5 - 1.9 mmol/L   CT Lumbar Spine Wo Contrast  Result Date: 02/09/2023 CLINICAL DATA:  Low back pain, spondyloarthropathy suspected, xray done EXAM: CT LUMBAR SPINE WITHOUT CONTRAST TECHNIQUE: Multidetector CT imaging of the lumbar spine was performed without intravenous contrast administration. Multiplanar CT image reconstructions were also generated. RADIATION DOSE REDUCTION: This exam was performed according to the departmental dose-optimization program which includes automated exposure control, adjustment of the mA and/or kV according to patient size and/or use of iterative reconstruction technique. COMPARISON:  None Available. FINDINGS: Segmentation: 5 lumbar type vertebrae. Alignment: Normal. Vertebrae: No acute fracture or focal pathologic process. Paraspinal and other soft tissues: There is a 5 mm nonobstructing calculus in the right kidney lower pole calyx. Soft tissues are otherwise within normal limits. Disc levels: Intervertebral disc heights are maintained. There are moderate  multilevel facet arthropathy and marginal osteophyte formation. There are mild diffuse disc bulges at multiple levels however, no significant spinal canal stenosis. IMPRESSION: *No acute fracture or traumatic malalignment. Moderate multilevel degenerative changes. Electronically Signed   By: Jules Schick M.D.   On: 02/09/2023 13:48   DG Chest 1 View  Result Date: 02/09/2023 CLINICAL DATA:  [id number] Fever [id number] EXAM: CHEST  1 VIEW COMPARISON:  None Available. FINDINGS: Bilateral lung fields are clear. Note is made of elevated right hemidiaphragm. Bilateral costophrenic angles are clear. Normal cardio-mediastinal silhouette. No acute osseous abnormalities. The soft tissues are within normal limits. IMPRESSION: No active disease. Electronically Signed   By: Jules Schick M.D.   On: 02/09/2023 13:43   DG Hip Unilat W or Wo Pelvis 2-3 Views Left  Result Date: 02/09/2023 CLINICAL DATA:  Pain EXAM: DG HIP (WITH OR WITHOUT PELVIS) bilateral hips, five views COMPARISON:  05/22/2019 x-ray FINDINGS: No fracture or dislocation. Preserved bone mineralization. Small osteophytes of both hips. Hyperostosis. Mild joint space loss of the sacroiliac joints. IMPRESSION: Degenerative changes. Electronically Signed   By: Karen Kays M.D.   On: 02/09/2023 12:12   DG Hip Unilat W or Wo Pelvis 2-3 Views Right  Result Date: 02/09/2023 CLINICAL DATA:  Pain EXAM: DG HIP (WITH OR WITHOUT PELVIS) bilateral hips, five views COMPARISON:  05/22/2019 x-ray FINDINGS: No fracture or dislocation. Preserved bone mineralization. Small osteophytes of both hips. Hyperostosis. Mild joint space loss of the sacroiliac joints. IMPRESSION: Degenerative changes. Electronically Signed   By: Karen Kays M.D.   On: 02/09/2023 12:12   DG Lumbar Spine Complete  Result Date: 02/09/2023 CLINICAL DATA:  Chronic back pain EXAM: LUMBAR SPINE - COMPLETE 5 VIEW COMPARISON:  None Available. FINDINGS: Five lumbar-type vertebral bodies. Preserved vertebral  body height, disc height and alignment except for trace anterolisthesis of L4 on L5. Scattered diffuse endplate osteophytes with near bridging areas along the lower lumbar spine. No spondylolysis. Note is made of scattered colonic stool. IMPRESSION: Trace anterolisthesis of L4 on L5.  Scattered degenerative changes. Electronically Signed   By: Scarlette Shorts  Chales Abrahams M.D.   On: 02/09/2023 12:09    Pending Labs Unresulted Labs (From admission, onward)     Start     Ordered   02/10/23 0500  Basic metabolic panel  Tomorrow morning,   R        02/09/23 1829   02/10/23 0500  CBC  Tomorrow morning,   R        02/09/23 1829   02/09/23 1613  Blood culture (routine x 2)  BLOOD CULTURE X 2,   R (with STAT occurrences)      02/09/23 1612   02/09/23 1613  Urine Culture  Once,   URGENT       Question:  Indication  Answer:  Sepsis   02/09/23 1612            Vitals/Pain Today's Vitals   02/09/23 1038 02/09/23 1357 02/09/23 1701 02/09/23 1758  BP:  (!) 156/91 137/79   Pulse:  (!) 109 100   Resp:  19 18   Temp:  (!) 100.8 F (38.2 C) 98.9 F (37.2 C)   TempSrc:  Oral Oral   SpO2:  94% 93%   Weight: 98.4 kg     Height: 5\' 7"  (1.702 m)     PainSc:    5     Isolation Precautions Airborne precautions  Medications Medications  lidocaine (LIDODERM) 5 % 1 patch (1 patch Transdermal Patch Applied 02/09/23 1358)  rivaroxaban (XARELTO) tablet 10 mg (10 mg Oral Given 02/09/23 1836)  methocarbamol (ROBAXIN) tablet 500 mg (500 mg Oral Given 02/09/23 1836)  cefTRIAXone (ROCEPHIN) 1 g in sodium chloride 0.9 % 100 mL IVPB (has no administration in time range)  amLODipine (NORVASC) tablet 10 mg (has no administration in time range)  aspirin EC tablet 81 mg (81 mg Oral Given 02/09/23 1906)  acetaminophen (TYLENOL) tablet 650 mg (has no administration in time range)  ketorolac (TORADOL) 15 MG/ML injection 15 mg (15 mg Intravenous Given 02/09/23 1357)  acetaminophen (TYLENOL) tablet 650 mg (650 mg Oral Given 02/09/23  1356)  methocarbamol (ROBAXIN) tablet 500 mg (500 mg Oral Given 02/09/23 1356)  cefTRIAXone (ROCEPHIN) 1 g in sodium chloride 0.9 % 100 mL IVPB (0 g Intravenous Stopped 02/09/23 1603)  morphine (PF) 2 MG/ML injection 2 mg (2 mg Intravenous Given 02/09/23 1531)    Mobility walks with device     Focused Assessments    R Recommendations: See Admitting Provider Note  Report given to:   Additional Notes: Patient is a very pleasant patient who is here for back pain, has a UTI and COVID. He is A&Ox4 and independent at baseline.

## 2023-02-09 NOTE — ED Notes (Signed)
Lab called to add on urine culture.  ?

## 2023-02-09 NOTE — ED Provider Notes (Signed)
Care of patient received from prior provider at 4:12 PM, please see their note for complete H/P and care plan.  Received handoff per ED course.  Clinical Course as of 02/09/23 1612  Wed Feb 09, 2023  1437 Reevaluated still having back pain.  Minimal improvement.Will give narcotics at this time for pain control.  [TY]  1506 Urinalysis, Routine w reflex microscopic -Urine, Clean Catch(!) Receiving Antibiotics.  [TY]  1506 SARS Coronavirus 2 by RT PCR(!): POSITIVE likely responsible for fever.  [TY]  1608 CT Lumbar Spine Wo Contrast IMPRESSION: *No acute fracture or traumatic malalignment. Moderate multilevel degenerative changes.   [TY]  1609 Stable 77 YOM with back pain Febrile tachycardic. ADMIT  [CC]    Clinical Course User Index [CC] Glyn Ade, MD [TY] Coral Spikes, DO    Reassessment: Treated for pyelonephritis, interval improvement while in the emergency room.  Discitis osteomyelitis may remain on the differential but considered less likely based on this presentation.  Will admit to the hospital under suspicion of pyelonephritis with plan for reassessment.  Discussed with internal medicine teaching service who agreed with need for admission.     Glyn Ade, MD 02/09/23 [phone]
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
## 2023-02-10 DIAGNOSIS — A4151 Sepsis due to Escherichia coli [E. coli]: Secondary | ICD-10-CM | POA: Diagnosis present

## 2023-02-10 DIAGNOSIS — N12 Tubulo-interstitial nephritis, not specified as acute or chronic: Principal | ICD-10-CM

## 2023-02-10 DIAGNOSIS — U071 COVID-19: Secondary | ICD-10-CM | POA: Diagnosis present

## 2023-02-10 DIAGNOSIS — D509 Iron deficiency anemia, unspecified: Secondary | ICD-10-CM | POA: Diagnosis present

## 2023-02-10 DIAGNOSIS — N390 Urinary tract infection, site not specified: Secondary | ICD-10-CM | POA: Diagnosis present

## 2023-02-10 DIAGNOSIS — Z7902 Long term (current) use of antithrombotics/antiplatelets: Secondary | ICD-10-CM | POA: Diagnosis not present

## 2023-02-10 DIAGNOSIS — Z91014 Allergy to mammalian meats: Secondary | ICD-10-CM | POA: Diagnosis not present

## 2023-02-10 DIAGNOSIS — Z7982 Long term (current) use of aspirin: Secondary | ICD-10-CM | POA: Diagnosis not present

## 2023-02-10 DIAGNOSIS — Z8673 Personal history of transient ischemic attack (TIA), and cerebral infarction without residual deficits: Secondary | ICD-10-CM | POA: Diagnosis not present

## 2023-02-10 DIAGNOSIS — H18609 Keratoconus, unspecified, unspecified eye: Secondary | ICD-10-CM | POA: Diagnosis present

## 2023-02-10 DIAGNOSIS — E871 Hypo-osmolality and hyponatremia: Secondary | ICD-10-CM | POA: Diagnosis present

## 2023-02-10 DIAGNOSIS — Z79899 Other long term (current) drug therapy: Secondary | ICD-10-CM | POA: Diagnosis not present

## 2023-02-10 DIAGNOSIS — M545 Low back pain, unspecified: Secondary | ICD-10-CM | POA: Diagnosis present

## 2023-02-10 DIAGNOSIS — E785 Hyperlipidemia, unspecified: Secondary | ICD-10-CM | POA: Diagnosis present

## 2023-02-10 DIAGNOSIS — A419 Sepsis, unspecified organism: Secondary | ICD-10-CM

## 2023-02-10 DIAGNOSIS — K5900 Constipation, unspecified: Secondary | ICD-10-CM | POA: Diagnosis present

## 2023-02-10 DIAGNOSIS — I10 Essential (primary) hypertension: Secondary | ICD-10-CM | POA: Diagnosis present

## 2023-02-10 DIAGNOSIS — N10 Acute tubulo-interstitial nephritis: Secondary | ICD-10-CM | POA: Diagnosis present

## 2023-02-10 DIAGNOSIS — G8929 Other chronic pain: Secondary | ICD-10-CM | POA: Diagnosis present

## 2023-02-10 LAB — HIV ANTIBODY (ROUTINE TESTING W REFLEX): HIV Screen 4th Generation wRfx: NONREACTIVE

## 2023-02-10 MED ORDER — ATORVASTATIN CALCIUM 80 MG PO TABS
80.0000 mg | ORAL_TABLET | Freq: Every day | ORAL | Status: DC
  Administered 2023-02-10 – 2023-02-11 (×2): 80 mg via ORAL
  Filled 2023-02-10 (×2): qty 1

## 2023-02-10 MED ORDER — LIDOCAINE 5 % EX PTCH
2.0000 | MEDICATED_PATCH | CUTANEOUS | Status: DC
  Administered 2023-02-10 – 2023-02-16 (×8): 2 via TRANSDERMAL
  Filled 2023-02-10 (×7): qty 2
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
## 2023-02-10 NOTE — Plan of Care (Signed)

## 2023-02-10 NOTE — Evaluation (Signed)
Physical Therapy Evaluation Patient Details Name: Tyler Alvarado MRN: [id number] DOB: [birth year] Today's Date: 02/10/2023  History of Present Illness  Patient is a 77 year old man admitted 7/31 presenting with back pain. Found to have UTI and also reported having developed a nonproductive cough with no shortness of breath and ultimately tested positive for COVID.  PMH:HTN, HLD, keratoconus, congenital nystagmus, corneal scarring,blindness  Clinical Impression  Pt admitted with above diagnosis. Pt was able to step pivot with mod assist and max cues to get into chair.  At current level, pt will need physical assist to go home therefore if companion cannot provide physical assist, will need post acute Rehab < 3 hours day.  Pt has had multiple falls per pt therefore feel this would be beneficial for pt.   Pt currently with functional limitations due to the deficits listed below (see PT Problem List). Pt will benefit from acute skilled PT to increase their independence and safety with mobility to allow discharge.           If plan is discharge home, recommend the following: A lot of help with walking and/or transfers;A lot of help with bathing/dressing/bathroom;Assistance with cooking/housework;Assist for transportation;Help with stairs or ramp for entrance   Can travel by private vehicle   No    Equipment Recommendations Rolling walker (2 wheels)  Recommendations for Other Services       Functional Status Assessment Patient has had a recent decline in their functional status and demonstrates the ability to make significant improvements in function in a reasonable and predictable amount of time.     Precautions / Restrictions Precautions Precautions: Fall Precaution Comments: COVID Restrictions Weight Bearing Restrictions: No      Mobility  Bed Mobility Overal bed mobility: Needs Assistance Bed Mobility: Supine to Sit     Supine to sit: Mod assist     General bed mobility  comments: Needed quite a bit of assist to come to EOB and to steady self.  Also incr time    Transfers Overall transfer level: Needs assistance Equipment used: Rolling walker (2 wheels) Transfers: Sit to/from Stand, Bed to chair/wheelchair/BSC Sit to Stand: Mod assist, From elevated surface   Step pivot transfers: Mod assist, From elevated surface       General transfer comment: Pt needed mod assist to power up as well as mod assist to step pivot to recliner from bed. Incr time and incr cues to stand tall and to take steps and safely get in front of chair prior to sitting. Flexed posture in sitting and standing    Ambulation/Gait               General Gait Details: Unable today due to pain  Stairs            Wheelchair Mobility     Tilt Bed    Modified Rankin (Stroke Patients Only)       Balance Overall balance assessment: Needs assistance Sitting-balance support: No upper extremity supported, Single extremity supported, Feet supported Sitting balance-Leahy Scale: Poor Sitting balance - Comments: Pt tends to lean right when sitting and at times needs support Postural control: Posterior lean, Right lateral lean Standing balance support: Bilateral upper extremity supported, During functional activity, Reliant on assistive device for balance Standing balance-Leahy Scale: Poor Standing balance comment: Needs mod assist to stand with RW  Pertinent Vitals/Pain Pain Assessment Pain Assessment: No/denies pain    Home Living Family/patient expects to be discharged to:: Private residence Living Arrangements: Spouse/significant other Available Help at Discharge: Family;Available PRN/intermittently Type of Home: House Home Access: Stairs to enter Entrance Stairs-Rails: Right;Left Entrance Stairs-Number of Steps: 5 Alternate Level Stairs-Number of Steps: 8 Home Layout: Multi-level;Laundry or work area in [employer] : BSC/3in1;Shower seat;Grab bars - tub/shower Additional Comments: 3 falls last 6 months, once in laundry area, hallway and bedroom    Prior Function Prior Level of Function : Independent/Modified Independent;History of Falls (last six months)             Mobility Comments: independent without device ADLs Comments: independent, no driving, does laundry but in basement, does all cooking     Hand Dominance   Dominant Hand: Right    Extremity/Trunk Assessment   Upper Extremity Assessment Upper Extremity Assessment: Defer to OT evaluation    Lower Extremity Assessment Lower Extremity Assessment: Generalized weakness    Cervical / Trunk Assessment Cervical / Trunk Assessment: Kyphotic  Communication   Communication: No difficulties  Cognition Arousal/Alertness: Awake/alert Behavior During Therapy: WFL for tasks assessed/performed Overall Cognitive Status: Within Functional Limits for tasks assessed                                          General Comments General comments (skin integrity, edema, etc.): VSS    Exercises     Assessment/Plan    PT Assessment Patient needs continued PT services  PT Problem List Decreased activity tolerance;Decreased balance;Decreased mobility;Decreased knowledge of use of DME;Decreased safety awareness;Decreased knowledge of precautions       PT Treatment Interventions DME instruction;Gait training;Functional mobility training;Therapeutic activities;Therapeutic exercise;Balance training;Wheelchair mobility training;Patient/family education    PT Goals (Current goals can be found in the Care Plan section)  Acute Rehab PT Goals Patient Stated Goal: to get better PT Goal Formulation: With patient Time For Goal Achievement: 02/24/23 Potential to Achieve Goals: Good    Frequency Min 1X/week     Co-evaluation               AM-PAC PT "6 Clicks" Mobility  Outcome Measure Help needed turning from your  back to your side while in a flat bed without using bedrails?: A Little Help needed moving from lying on your back to sitting on the side of a flat bed without using bedrails?: A Lot Help needed moving to and from a bed to a chair (including a wheelchair)?: A Lot Help needed standing up from a chair using your arms (e.g., wheelchair or bedside chair)?: A Lot Help needed to walk in hospital room?: Total Help needed climbing 3-5 steps with a railing? : Total 6 Click Score: 11    End of Session Equipment Utilized During Treatment: Gait belt Activity Tolerance: Patient limited by fatigue Patient left: in chair;with call bell/phone within reach;with chair alarm set Nurse Communication: Mobility status PT Visit Diagnosis: Muscle weakness (generalized) (M62.81);Unsteadiness on feet (R26.81)    Time: 1610-9604 PT Time Calculation (min) (ACUTE ONLY): 25 min   Charges:   PT Evaluation $PT Eval Moderate Complexity: 1 Mod PT Treatments $Therapeutic Activity: 8-22 mins PT General Charges $$ ACUTE PT VISIT: 1 Visit         Dellene Mcgroarty M,PT Acute Rehab Services [phone]   Bevelyn Buckles 02/10/2023, 12:28 PM

## 2023-02-10 NOTE — TOC Initial Note (Addendum)
Transition of Care [facility]) - Initial/Assessment Note    Patient Details  Name: Tyler Alvarado MRN: [id number] Date of Birth: 04-23-1946  Transition of Care [facility]) CM/SW Contact:    Leone Haven, RN Phone Number: 02/10/2023, 11:04 AM  Clinical Narrative:                 From  home with his companion, he has PCP, Dr. Pecola Leisure, he has insurance on file.  He states he currently does not have any HH services in place at this time or DME.  He states his son or daughter will transport him home, they are [place] support,  he gets his meds from [facility] on [address]  He is self ambulatory.  He is blind.  Expected Discharge Plan: Home w Home Health Services Barriers to Discharge: Continued Medical Work up   Patient Goals and CMS Choice Patient states their goals for this hospitalization and ongoing recovery are:: return home   Choice offered to / list presented to : NA      Expected Discharge Plan and Services In-house Referral: NA Discharge Planning Services: CM Consult Post Acute Care Choice: NA Living arrangements for the past 2 months: Single Family Home                 DME Arranged: N/A DME Agency: NA       HH Arranged: NA          Prior Living Arrangements/Services Living arrangements for the past 2 months: Single Family Home Lives with:: Significant Other Patient language and need for interpreter reviewed:: Yes Do you feel safe going back to the place where you live?: Yes      Need for Family Participation in Patient Care: Yes (Comment) Care giver support system in place?: Yes (comment)   Criminal Activity/Legal Involvement Pertinent to Current Situation/Hospitalization: No - Comment as needed  Activities of Daily Living Home Assistive Devices/Equipment: Cane (specify quad or straight) ADL Screening (condition at time of admission) Patient's cognitive ability adequate to safely complete daily activities?: Yes Is the patient deaf or have difficulty hearing?:  No Does the patient have difficulty seeing, even when wearing glasses/contacts?: Yes Does the patient have difficulty concentrating, remembering, or making decisions?: No Patient able to express need for assistance with ADLs?: Yes Does the patient have difficulty dressing or bathing?: No Independently performs ADLs?: Yes (appropriate for developmental age) Does the patient have difficulty walking or climbing stairs?: Yes Weakness of Legs: Both Weakness of Arms/Hands: None  Permission Sought/Granted                  Emotional Assessment   Attitude/Demeanor/Rapport:  (Appropriate) Affect (typically observed): Appropriate Orientation: : Oriented to Self, Oriented to Place, Oriented to  Time, Oriented to Situation Alcohol / Substance Use: Not Applicable Psych Involvement: No (comment)  Admission diagnosis:  COVID-19 [U07.1] Pyelonephritis [N12] Patient Active Problem List   Diagnosis Date Noted   Hyponatremia 02/10/2023   UTI (urinary tract infection) 02/10/2023   Pyelonephritis 02/10/2023   COVID-19 02/09/2023   Essential hypertension 07/26/2021   Hyperlipidemia 07/26/2021   Stroke (HCC) 07/25/2021   PCP:  Leilani Able, MD Pharmacy:   [phone] [place], [place] - [address] [place] [address] Phone: [phone] Fax: [phone]     Social Determinants of Health (SDOH) Social History: SDOH Screenings   Food Insecurity: No Food Insecurity (02/09/2023)  Housing: Low Risk  (02/09/2023)  Transportation Needs: No Transportation  Needs (02/09/2023)  Utilities: Not At Risk (02/09/2023)  Social Connections: Unknown (02/08/2023)   Received from [facility]  Tobacco Use: Unknown (02/09/2023)   SDOH Interventions:     Readmission Risk Interventions     No data to display

## 2023-02-11 DIAGNOSIS — U071 COVID-19: Secondary | ICD-10-CM | POA: Diagnosis not present

## 2023-02-11 DIAGNOSIS — A419 Sepsis, unspecified organism: Secondary | ICD-10-CM | POA: Diagnosis not present

## 2023-02-11 DIAGNOSIS — N12 Tubulo-interstitial nephritis, not specified as acute or chronic: Secondary | ICD-10-CM | POA: Diagnosis not present

## 2023-02-11 MED ORDER — AMLODIPINE BESYLATE 10 MG PO TABS: 10.0000 mg | ORAL_TABLET | Freq: Every day | ORAL | Status: DC

## 2023-02-11 MED ORDER — AMLODIPINE BESYLATE 10 MG PO TABS
10.0000 mg | ORAL_TABLET | Freq: Every day | ORAL | Status: DC
  Administered 2023-02-12 – 2023-02-16 (×5): 10 mg via ORAL
  Filled 2023-02-11 (×5): qty 1

## 2023-02-11 NOTE — Plan of Care (Signed)

## 2023-02-11 NOTE — Evaluation (Addendum)
Occupational Therapy Evaluation Patient Details Name: Tyler Alvarado MRN: [id number] DOB: 07-10-1946 Today's Date: 02/11/2023   History of Present Illness Patient is a 77 year old man admitted 7/31 presenting with back pain. Found to have UTI and also reported having developed a nonproductive cough with no shortness of breath and ultimately tested positive for COVID.  PMH:HTN, HLD, keratoconus, congenital nystagmus, corneal scarring,blindness   Clinical Impression   PTA, pt lived with companion and reports several recent falls at home. Pt reports companion stays busy and is unable to physically care for him. Pt requiring min A for basic transfers at time of eval. Requiring mod directional cues and cues for problem solving thoughout. Pt with undershooting during self feeding and grooming requiring up to min A; pt reports related to vertigo but no overt balance difficulty seated or with basic transfer. Very pleasant and conversational. Patient will benefit from continued inpatient follow up therapy, <3 hours/day as pt with multiple recent falls and limited assist at home.      Recommendations for follow up therapy are one component of a multi-disciplinary discharge planning process, led by the attending physician.  Recommendations may be updated based on patient status, additional functional criteria and insurance authorization.   Assistance Recommended at Discharge Intermittent Supervision/Assistance  Patient can return home with the following A little help with walking and/or transfers;A little help with bathing/dressing/bathroom;A lot of help with bathing/dressing/bathroom;Assistance with cooking/housework;Assist for transportation;Help with stairs or ramp for entrance    Functional Status Assessment  Patient has had a recent decline in their functional status and demonstrates the ability to make significant improvements in function in a reasonable and predictable amount of time.  Equipment  Recommendations  None recommended by OT    Recommendations for Other Services       Precautions / Restrictions Precautions Precautions: Fall Precaution Comments: COVID Restrictions Weight Bearing Restrictions: No      Mobility Bed Mobility Overal bed mobility: Needs Assistance Bed Mobility: Supine to Sit     Supine to sit: Min assist     General bed mobility comments: signifcantly increased time. Assist to elevate trunk    Transfers Overall transfer level: Needs assistance Equipment used: None Transfers: Sit to/from Stand, Bed to chair/wheelchair/BSC Sit to Stand: Min assist     Step pivot transfers: Min assist     General transfer comment: for guidance and cues      Balance Overall balance assessment: Needs assistance Sitting-balance support: No upper extremity supported, Single extremity supported, Feet supported Sitting balance-Leahy Scale: Poor     Standing balance support: Bilateral upper extremity supported, During functional activity, Reliant on assistive device for balance Standing balance-Leahy Scale: Poor                             ADL either performed or assessed with clinical judgement   ADL Overall ADL's : Needs assistance/impaired Eating/Feeding: Minimal assistance;Sitting Eating/Feeding Details (indicate cue type and reason): undershooting. Grooming: Sitting;Min guard;Wash/dry face Grooming Details (indicate cue type and reason): to wipe eyes Upper Body Bathing: Minimal assistance;Sitting   Lower Body Bathing: Moderate assistance;Sit to/from stand;Sitting/lateral leans   Upper Body Dressing : Minimal assistance;Sitting   Lower Body Dressing: Moderate assistance;Sit to/from stand;Sitting/lateral leans   Toilet Transfer: Minimal assistance;Stand-pivot Toilet Transfer Details (indicate cue type and reason): saimulated to chair. Min A for guidance only         Functional mobility during ADLs: Minimal assistance  Vision Baseline Vision/History: 2 Legally blind Ability to See in Adequate Light: 3 Highly impaired Patient Visual Report: No change from baseline Additional Comments: able to see shadows of people and large objects in room     Perception     Praxis      Pertinent Vitals/Pain Pain Assessment Pain Assessment: No/denies pain     Hand Dominance Right   Extremity/Trunk Assessment Upper Extremity Assessment Upper Extremity Assessment: Generalized weakness   Lower Extremity Assessment Lower Extremity Assessment: Generalized weakness   Cervical / Trunk Assessment Cervical / Trunk Assessment: Kyphotic   Communication Communication Communication: No difficulties   Cognition Arousal/Alertness: Awake/alert Behavior During Therapy: WFL for tasks assessed/performed Overall Cognitive Status: Impaired/Different from baseline Area of Impairment: Problem solving                             Problem Solving: Slow processing General Comments: intermittently requiring increased time to understand what therapist is discussing or asking pt to do     General Comments  Pt reports vertigo and equilibrium off. VSS    Exercises     Shoulder Instructions      Home Living Family/patient expects to be discharged to:: Private residence Living Arrangements: Spouse/significant other ("companion") Available Help at Discharge: Family;Available PRN/intermittently Type of Home: House Home Access: Stairs to enter [employer] of Steps: 5 Entrance Stairs-Rails: Right;Left Home Layout: Multi-level;Laundry or work area in [employer] of Steps: 8   Bathroom Shower/Tub: Chief Strategy Officer: Handicapped height     Home Equipment: BSC/3in1;Shower seat;Grab bars - tub/shower   Additional Comments: 3 falls last 6 months, once in laundry area, hallway and bedroom      Prior Functioning/Environment Prior Level of Function :  Independent/Modified Independent;History of Falls (last six months)             Mobility Comments: independent without device ADLs Comments: independent, no driving, does laundry but in basement, does all cooking        OT Problem List: Decreased strength;Impaired balance (sitting and/or standing);Decreased activity tolerance;Decreased cognition;Decreased safety awareness;Decreased knowledge of use of DME or AE;Impaired UE functional use      OT Treatment/Interventions: Self-care/ADL training;Therapeutic exercise;DME and/or AE instruction;Balance training;Patient/family education;Therapeutic activities    OT Goals(Current goals can be found in the care plan section) Acute Rehab OT Goals Patient Stated Goal: go to rehab OT Goal Formulation: With patient Time For Goal Achievement: 02/25/23 Potential to Achieve Goals: Good  OT Frequency: Min 1X/week    Co-evaluation              AM-PAC OT "6 Clicks" Daily Activity     Outcome Measure Help from another person eating meals?: A Little Help from another person taking care of personal grooming?: A Little Help from another person toileting, which includes using toliet, bedpan, or urinal?: A Lot Help from another person bathing (including washing, rinsing, drying)?: A Little Help from another person to put on and taking off regular upper body clothing?: A Little Help from another person to put on and taking off regular lower body clothing?: A Lot 6 Click Score: 16   End of Session Equipment Utilized During Treatment: Gait belt Nurse Communication: Mobility status  Activity Tolerance: Patient tolerated treatment well Patient left: in chair;with call bell/phone within reach;with chair alarm set  OT Visit Diagnosis: Unsteadiness on feet (R26.81);Muscle weakness (generalized) (M62.81);Low vision, both eyes (H54.2)  Time: 1255-1320 OT Time Calculation (min): 25 min Charges:  OT General Charges $OT Visit: 1  Visit OT Evaluation $OT Eval Moderate Complexity: 1 Mod OT Treatments $Self Care/Home Management : 8-22 mins  Tyler Deis, OTR/L [facility] Acute Rehabilitation Office: [phone]   Myrla Halsted 02/11/2023, 2:39 PM

## 2023-02-11 NOTE — Progress Notes (Signed)
Physical Therapy Treatment Patient Details Name: Tyler Alvarado MRN: [id number] DOB: 1946/03/22 Today's Date: 02/11/2023   History of Present Illness Patient is a 77 year old man admitted 7/31 presenting with back pain. Found to have UTI and also reported having developed a nonproductive cough with no shortness of breath and ultimately tested positive for COVID.  PMH:HTN, HLD, keratoconus, congenital nystagmus, corneal scarring,blindness    PT Comments  Progressed well today.  Fatigued, but enjoyed being up for 30 min or so.  Emphasis on warm up exercise, transition to EOB, sitting/scooting at EOB, sit to stand, deciding on appropriate AD, gait in the room with the RW and standing tasks to wash hands and face at the sink to work on endurance and posture.     If plan is discharge home, recommend the following: A lot of help with walking and/or transfers;A lot of help with bathing/dressing/bathroom;Assistance with cooking/housework;Assist for transportation;Help with stairs or ramp for entrance   Can travel by private vehicle     No  Equipment Recommendations  Rolling walker (2 wheels)    Recommendations for Other Services       Precautions / Restrictions Precautions Precautions: Fall Precaution Comments: COVID Restrictions Weight Bearing Restrictions: No     Mobility  Bed Mobility Overal bed mobility: Needs Assistance Bed Mobility: Supine to Sit     Supine to sit: Min assist     General bed mobility comments: signifcantly increased time. Assist to elevate trunk    Transfers Overall transfer level: Needs assistance Equipment used: None Transfers: Sit to/from Stand, Bed to chair/wheelchair/BSC Sit to Stand: Min assist   Step pivot transfers: Min assist       General transfer comment: for guidance and cues    Ambulation/Gait Ambulation/Gait assistance: Min assist (mod to max cues) Gait Distance (Feet): 15 Feet (x2 with 10 min standing at sink washing  face) Assistive device: Rolling walker (2 wheels), IV Pole Gait Pattern/deviations: Step-to pattern, Decreased step length - right, Decreased step length - left, Decreased stance time - right, Decreased stance time - left, Decreased stride length   Gait velocity interpretation: <1.31 ft/sec, indicative of household ambulator   General Gait Details: unsteady, variable steps. degrading posture over 25 min mobilizing.  Very slow mobility, partly due to vision, environment, RW, weakness.  Followed direction fairly well.   Stairs             Wheelchair Mobility     Tilt Bed    Modified Rankin (Stroke Patients Only)       Balance Overall balance assessment: Needs assistance Sitting-balance support: No upper extremity supported, Single extremity supported, Feet supported Sitting balance-Leahy Scale: Poor Sitting balance - Comments: Pt tends to lean right when sitting and at times needs support.  Though showed ability to sit without UE if cued to do so.  Not preferable to patient though   Standing balance support: Bilateral upper extremity supported, During functional activity, Reliant on assistive device for balance Standing balance-Leahy Scale: Poor Standing balance comment: this pm min to min guard in RW and at sink with no hands supporting with hand drying.                            Cognition Arousal/Alertness: Awake/alert Behavior During Therapy: WFL for tasks assessed/performed Overall Cognitive Status: Impaired/Different from baseline Area of Impairment: Problem solving  Problem Solving: Slow processing General Comments: intermittently requiring increased time to understand what therapist is discussing or asking pt to do        Exercises Other Exercises Other Exercises: hip knee flex/ext ROM with graded assist/resistance for warm up x10 reps bil Other Exercises: bicep /tricep presses x10 reps bil with graded  resistance.    General Comments General comments (skin integrity, edema, etc.): vss      Pertinent Vitals/Pain Pain Assessment Pain Assessment: Faces Pain Score: 2  Pain Location: hips with ROM    Home Living Family/patient expects to be discharged to:: Private residence Living Arrangements: Spouse/significant other ("companion") Available Help at Discharge: Family;Available PRN/intermittently Type of Home: House Home Access: Stairs to enter Entrance Stairs-Rails: Right;Left Entrance Stairs-Number of Steps: 5 Alternate Level Stairs-Number of Steps: 8 Home Layout: Multi-level;Laundry or work area in [employer]: BSC/3in1;Shower seat;Grab bars - tub/shower Additional Comments: 3 falls last 6 months, once in laundry area, hallway and bedroom    Prior Function            PT Goals (current goals can now be found in the care plan section) Acute Rehab PT Goals Patient Stated Goal: to get better PT Goal Formulation: With patient Time For Goal Achievement: 02/24/23 Potential to Achieve Goals: Good Progress towards PT goals: Progressing toward goals    Frequency    Min 1X/week      PT Plan Current plan remains appropriate    Co-evaluation              AM-PAC PT "6 Clicks" Mobility   Outcome Measure  Help needed turning from your back to your side while in a flat bed without using bedrails?: A Little Help needed moving from lying on your back to sitting on the side of a flat bed without using bedrails?: A Lot Help needed moving to and from a bed to a chair (including a wheelchair)?: A Lot Help needed standing up from a chair using your arms (e.g., wheelchair or bedside chair)?: A Lot Help needed to walk in hospital room?: A Lot Help needed climbing 3-5 steps with a railing? : Total 6 Click Score: 12    End of Session Equipment Utilized During Treatment: Gait belt Activity Tolerance: Patient tolerated treatment well;Patient limited by  fatigue Patient left: in bed;with call bell/phone within reach;with bed alarm set;Other (comment) (set up for dinner) Nurse Communication: Mobility status PT Visit Diagnosis: Muscle weakness (generalized) (M62.81);Unsteadiness on feet (R26.81)     Time: 1610-9604 PT Time Calculation (min) (ACUTE ONLY): 43 min  Charges:    $Gait Training: 8-22 mins $Therapeutic Exercise: 8-22 mins $Therapeutic Activity: 8-22 mins PT General Charges $$ ACUTE PT VISIT: 1 Visit                     02/11/2023  Jacinto Halim., PT Acute Rehabilitation Services [phone]  (office)   Tyler Alvarado 02/11/2023, 7:44 PM

## 2023-02-11 NOTE — NC FL2 (Signed)
[place] MEDICAID FL2 LEVEL OF CARE FORM     IDENTIFICATION  Patient Name: Tyler Alvarado Birthdate: [birth year] Sex: male Admission Date (Current Location): 02/09/2023  [facility] and [place] Number:  Producer, television/film/video and Address:  [facility] [place], [address], [place], [place] [address]      Provider Number: [id number]  Attending Physician Name and Address:  Gust Rung, DO  Relative Name and Phone Number:  Eliseo Squires (Daughter)  [phone]    Current Level of Care: Hospital Recommended Level of Care: Skilled Nursing Facility Prior Approval Number:    Date Approved/Denied:   PASRR Number: [id number]  Discharge Plan: SNF    Current Diagnoses: Patient Active Problem List   Diagnosis Date Noted   Hyponatremia 02/10/2023   UTI (urinary tract infection) 02/10/2023   Pyelonephritis 02/10/2023   Sepsis due to urinary tract infection (HCC) 02/10/2023   COVID-19 02/09/2023   Essential hypertension 07/26/2021   Hyperlipidemia 07/26/2021   Stroke (HCC) 07/25/2021    Orientation RESPIRATION BLADDER Height & Weight     Place, Situation, Time, Self  Normal Incontinent Weight: 181 lb 8 oz (82.3 kg) Height:  5\' 7"  (170.2 cm)  BEHAVIORAL SYMPTOMS/MOOD NEUROLOGICAL BOWEL NUTRITION STATUS      Incontinent Diet (see d/c summary)  AMBULATORY STATUS COMMUNICATION OF NEEDS Skin   Total Care Verbally Normal                       Personal Care Assistance Level of Assistance  Bathing, Feeding, Dressing Bathing Assistance: Maximum assistance Feeding assistance: Limited assistance Dressing Assistance: Maximum assistance     Functional Limitations Info  Sight, Hearing, Speech Sight Info: Impaired Hearing Info: Adequate Speech Info: Adequate    SPECIAL CARE FACTORS FREQUENCY  OT (By licensed OT), PT (By licensed PT)     PT Frequency: 5x/ week OT Frequency: 5x/ week            Contractures Contractures Info: Not present     Additional Factors Info  Code Status, Allergies Code Status Info: Full Allergies Info: Pork-derived Products           Current Medications (02/11/2023):  This is the current hospital active medication list Current Facility-Administered Medications  Medication Dose Route Frequency Provider Last Rate Last Admin   acetaminophen (TYLENOL) tablet 650 mg  650 mg Oral Q6H PRN Nooruddin, Saad, MD   650 mg at 02/10/23 0903   [START ON 02/12/2023] amLODipine (NORVASC) tablet 10 mg  10 mg Oral Daily Monna Fam, MD       aspirin EC tablet 81 mg  81 mg Oral Daily Nooruddin, Saad, MD   81 mg at 02/11/23 0825   cefTRIAXone (ROCEPHIN) 1 g in sodium chloride 0.9 % 100 mL IVPB  1 g Intravenous Q24H Nooruddin, Saad, MD 200 mL/hr at 02/11/23 1320 1 g at 02/11/23 1320   lidocaine (LIDODERM) 5 % 2 patch  2 patch Transdermal Q24H Carlynn Purl C, DO   2 patch at 02/11/23 6387   methocarbamol (ROBAXIN) tablet 500 mg  500 mg Oral Q8H PRN Nooruddin, Jason Fila, MD   500 mg at 02/10/23 0258   Oral care mouth rinse  15 mL Mouth Rinse PRN Gust Rung, DO       rivaroxaban Carlena Hurl) tablet 10 mg  10 mg Oral Daily Nooruddin, Saad, MD   10 mg at 02/10/23 1735     Discharge Medications: Please see discharge summary for a list of  discharge medications.  Relevant Imaging Results:  Relevant Lab Results:   Additional Information SSN:  [id number]; Tested positive for COVID on 7/31  Javarian Jakubiak F [place] Catarino, LCSW

## 2023-02-11 NOTE — TOC Initial Note (Signed)
Transition of Care [facility]) - Initial/Assessment Note    Patient Details  Name: Tyler Alvarado MRN: [id number] Date of Birth: [birth year]  Transition of Care [facility]) CM/SW Contact:    Ralene Bathe, LCSW Phone Number: 02/11/2023, 4:28 PM  Clinical Narrative:                 CSW received consult for possible SNF placement at time of discharge. CSW spoke with patient. Patient expressed understanding of PT recommendation and is agreeable to SNF placement at time of discharge. Patient reports preference for a facility in [place]. CSW discussed insurance authorization process and will provide Medicare SNF ratings list. CSW will send out referrals for review and provide bed offers as available.   Skilled Nursing Rehab Facilities-   [email or web address]   Ratings out of 5 stars (5 the highest)   Name Address  Phone # Quality Care Staffing Health Inspection Overall  [facility] [address], [place] [phone] 2 1 5 4   [facility] [address], [place] [phone] 4 1 3 2   [facility] [address], [facility] [phone] 2 1 1 1   [facility] [address] [place] [phone] 4 1 3 2   [facility]  [address], [place] [phone] 3 2 5 5   [facility] [address] [facility] [phone] 2 1 2 1   [facility] [address], [place] [phone] 4 1 2 1   [facility] [address] [place] [phone] 2 4 3 3   [address] ([place]) [address], [place] [phone] 3 2 2 2   [facility] [address] [place], [place] [phone] 1 2 1 1   [facility] ([place]) [address], [place] [phone] 3 1 1 1   [facility] [address] Liliane Shi [phone] 4 3 4 4   [facility] [address] [place] [phone] 3 4 3 3           [facility] [address] [place] [phone]      [facility], [place] [address], [place] [phone] 1 1 2 1   [facility] [address] [place] [phone] 2 2 4 4   [facility] [place] [address], [place] [phone] 2 1 4 3   [facility] [address] [place] [phone] 3 3 3 3           [address] (no [facility]) [address], [place] [phone] 4 4 5 5   [facility] (No Hum[employer]) [address] [phone] 2 2 4 4   [facility] [address] [place] [phone] 2 1 2 1   [facility] (No UHC) [address], [place] [phone] 5 5 5 5   [facility] [address] [facility] [phone] 2 3 5 5   [facility] [address], [place] [phone] 2 1 1 1   [facility] [address] Liliane Shi [phone] 5 2 5 5   [facility]  [address], [place] [phone] 2 2 2 2   [facility] [address] [place] [phone] 4 2 1 1   [facility] [address], [place] [phone] 2 2 3 3           [facility] [address] [place] [phone] 1 1 1 1   [facility] [address] Evlyn Clines  [phone] 2 3 3 3   [facility] (No Humana) [address], [place] [phone] 2 1 3 2   [facility]) [address], [place]an [phone] 1 1 1 1   [facility] [place] [address], [place] [phone] 3 2 5 5   [facility] [address], [place] [phone] 2 1 1 1           [facility] [address] [place], [place] [phone] 4 4 5 5   [facility])  [address] [place] [phone] 2 1 2 1   [facility]) [address] [place] [phone]  1 4 3   [facility] [address], [place] [phone] 3 5 4 5   [address] [place], [place] [phone] 3 2 2 2   [facility] [address] [place] [phone] 2 1 3 2     Expected Discharge Plan: Home w Home Health  Services Barriers to Discharge: Continued Medical Work up   Patient Goals and CMS Choice Patient states their goals for this hospitalization and ongoing recovery are:: return home   Choice offered to / list presented to : NA      Expected Discharge Plan and Services In-house Referral: NA Discharge Planning Services: CM Consult Post Acute Care Choice: NA Living arrangements for the past 2 months: Single Family Home                 DME Arranged: N/A DME Agency: NA       HH Arranged: NA          Prior Living Arrangements/Services Living arrangements for the past 2 months: Single Family Home Lives with:: Significant Other Patient language and need for interpreter reviewed:: Yes Do you feel safe going back to the place where you live?: Yes      Need for Family Participation in Patient Care: Yes (Comment) Care giver support system in place?: Yes (comment)   Criminal Activity/Legal Involvement Pertinent to Current Situation/Hospitalization: No - Comment as needed  Activities of Daily Living Home Assistive Devices/Equipment: Cane (specify quad or straight) ADL Screening (condition at time of admission) Patient's cognitive ability adequate to safely complete daily activities?: Yes Is the patient deaf or have difficulty hearing?: No Does the patient have difficulty seeing, even when wearing glasses/contacts?: Yes Does the patient have difficulty concentrating, remembering, or making decisions?: No Patient able to express need for assistance with ADLs?: Yes Does the patient have difficulty dressing or bathing?: No Independently performs ADLs?: Yes (appropriate for developmental age) Does the patient have difficulty walking or climbing stairs?: Yes Weakness of Legs: Both Weakness of Arms/Hands: None  Permission Sought/Granted                  Emotional Assessment   Attitude/Demeanor/Rapport:  (Appropriate) Affect (typically observed): Appropriate Orientation: :  Oriented to Self, Oriented to Place, Oriented to  Time, Oriented to Situation Alcohol / Substance Use: Not Applicable Psych Involvement: No (comment)  Admission diagnosis:  COVID-19 [U07.1] Pyelonephritis [N12] Patient Active Problem List   Diagnosis Date Noted   Hyponatremia 02/10/2023   UTI (urinary tract infection) 02/10/2023   Pyelonephritis 02/10/2023   Sepsis due to urinary tract infection (HCC) 02/10/2023   COVID-19 02/09/2023   Essential hypertension 07/26/2021   Hyperlipidemia 07/26/2021   Stroke (HCC) 07/25/2021   PCP:  Leilani Able, MD Pharmacy:   [facility] [phone] [place] Otto, [place] - [address] [place] [address] Phone: [phone] Fax: [phone]     Social Determinants of Health (SDOH) Social History: SDOH Screenings   Food Insecurity: No Food Insecurity (02/09/2023)  Housing: Low Risk  (02/09/2023)  Transportation Needs: No Transportation Needs (02/09/2023)  Utilities: Not At Risk (02/09/2023)  Social Connections: Unknown (02/08/2023)   Received from [facility]  Tobacco Use: Unknown (02/09/2023)   SDOH Interventions:     Readmission Risk Interventions     No data to display
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
## 2023-02-12 DIAGNOSIS — N12 Tubulo-interstitial nephritis, not specified as acute or chronic: Secondary | ICD-10-CM

## 2023-02-12 MED ORDER — AMOXICILLIN 500 MG PO CAPS
500.0000 mg | ORAL_CAPSULE | Freq: Three times a day (TID) | ORAL | Status: DC
  Administered 2023-02-12 – 2023-02-16 (×14): 500 mg via ORAL
  Filled 2023-02-12 (×16): qty 1
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
## 2023-02-12 NOTE — Plan of Care (Signed)
  Problem: Education: Goal: Knowledge of General Education information will improve Description: Including pain rating scale, medication(s)/side effects and non-pharmacologic comfort measures Outcome: Progressing   Problem: Clinical Measurements: Goal: Ability to maintain clinical measurements within normal limits will improve Outcome: Progressing Goal: Will remain free from infection Outcome: Progressing Goal: Diagnostic test results will improve Outcome: Progressing Goal: Respiratory complications will improve Outcome: Progressing Goal: Cardiovascular complication will be avoided Outcome: Progressing   Problem: Activity: Goal: Risk for activity intolerance will decrease Outcome: Progressing   Problem: Nutrition: Goal: Adequate nutrition will be maintained Outcome: Progressing   Problem: Coping: Goal: Level of anxiety will decrease Outcome: Progressing   Problem: Elimination: Goal: Will not experience complications related to bowel motility Outcome: Progressing Goal: Will not experience complications related to urinary retention Outcome: Progressing   Problem: Pain Managment: Goal: General experience of comfort will improve Outcome: Progressing   Problem: Safety: Goal: Ability to remain free from injury will improve Outcome: Progressing   

## 2023-02-13 LAB — CBC
HCT: 34.8 % — ABNORMAL LOW (ref 39.0–52.0)
Hemoglobin: 11.5 g/dL — ABNORMAL LOW (ref 13.0–17.0)
MCH: 29.4 pg (ref 26.0–34.0)
MCHC: 33 g/dL (ref 30.0–36.0)
MCV: 89 fL (ref 80.0–100.0)
Platelets: 193 10*3/uL (ref 150–400)
RBC: 3.91 MIL/uL — ABNORMAL LOW (ref 4.22–5.81)
RDW: 15.2 % (ref 11.5–15.5)
WBC: 3.9 10*3/uL — ABNORMAL LOW (ref 4.0–10.5)
nRBC: 0 % (ref 0.0–0.2)

## 2023-02-13 LAB — BASIC METABOLIC PANEL WITH GFR
Anion gap: 11 (ref 5–15)
BUN: 13 mg/dL (ref 8–23)
CO2: 25 mmol/L (ref 22–32)
Calcium: 7.9 mg/dL — ABNORMAL LOW (ref 8.9–10.3)
Chloride: 94 mmol/L — ABNORMAL LOW (ref 98–111)
Creatinine, Ser: 0.78 mg/dL (ref 0.61–1.24)
GFR, Estimated: >60 mL/min (ref >60)
Glucose, Bld: 101 mg/dL — ABNORMAL HIGH (ref 70–99)
Potassium: 3.7 mmol/L (ref 3.5–5.1)
Sodium: 130 mmol/L — ABNORMAL LOW (ref 135–145)
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
## 2023-02-13 NOTE — Plan of Care (Signed)

## 2023-02-13 NOTE — Plan of Care (Signed)

## 2023-02-14 DIAGNOSIS — D509 Iron deficiency anemia, unspecified: Secondary | ICD-10-CM

## 2023-02-14 DIAGNOSIS — A4151 Sepsis due to Escherichia coli [E. coli]: Secondary | ICD-10-CM | POA: Diagnosis not present

## 2023-02-14 DIAGNOSIS — N390 Urinary tract infection, site not specified: Secondary | ICD-10-CM | POA: Diagnosis not present

## 2023-02-14 DIAGNOSIS — U071 COVID-19: Secondary | ICD-10-CM | POA: Diagnosis not present

## 2023-02-14 MED ORDER — LACTATED RINGERS IV BOLUS
1000.0000 mL | Freq: Once | INTRAVENOUS | Status: AC
  Administered 2023-02-14: 1000 mL via INTRAVENOUS
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
## 2023-02-14 NOTE — Progress Notes (Signed)
Physical Therapy Treatment Patient Details Name: Tyler Alvarado MRN: [id number] DOB: [birth year] Today's Date: 02/14/2023   History of Present Illness Patient is a 77 year old man admitted 7/31 presenting with back pain. Found to have UTI and also reported having developed a nonproductive cough with no shortness of breath and ultimately tested positive for COVID.  PMH:HTN, HLD, keratoconus, congenital nystagmus, corneal scarring,blindness    PT Comments  Patient resting in bed and glad to participate in therapy reporting he has been stiff and wants to loosen up. Start of session exercises completed in bed for LE ROM to reduce stiffness prior to mobilizing OOB. Pt required min assist to move supine>sit and was able to initiate raise trunk and stabilize balance at EOB with bil UE support. Min assist to power up required for sit<>stand and to guide pivot to chair. Pt required cues for hand placement on armrest to rise from chair. In standing pt required manual assist to weight shift anteriorly for balance and cues for more upright posture. Pt amb 2x~10' with RW and required assist to advance walker to improve step initiation. EOS pt agreeable to remain OOB in recliner, Alarm on and call bell within reach. Will continue to progress pt as able.   If plan is discharge home, recommend the following: A lot of help with walking and/or transfers;A lot of help with bathing/dressing/bathroom;Assistance with cooking/housework;Direct supervision/assist for medications management;Direct supervision/assist for financial management;Assist for transportation;Help with stairs or ramp for entrance   Can travel by private vehicle     No  Equipment Recommendations  Rolling walker (2 wheels)    Recommendations for Other Services       Precautions / Restrictions Precautions Precautions: Fall Precaution Comments: COVID Restrictions Weight Bearing Restrictions: No     Mobility  Bed Mobility Overal bed mobility:  Needs Assistance Bed Mobility: Supine to Sit     Supine to sit: Min assist     General bed mobility comments: cues to place bil UE on bed rail and min assist to flex bil LE's and move off EOB. Pt able to initiate raising trunk and min assist to raise fully upright.    Transfers Overall transfer level: Needs assistance Equipment used: Rolling walker (2 wheels) Transfers: Sit to/from Stand, Bed to chair/wheelchair/BSC Sit to Stand: Min assist   Step pivot transfers: Min assist       General transfer comment: cues for hand placement to power up at EOB and min assist to steady with rise. Pt using back of legs against bed at start to stabilize and slight posterior lean noted. pt required  cues for hand placement on armrest to rise from recliner.    Ambulation/Gait Ambulation/Gait assistance: Min assist Gait Distance (Feet): 10 Feet (2x10' with RW) Assistive device: Rolling walker (2 wheels), IV Pole Gait Pattern/deviations: Step-to pattern, Decreased step length - right, Decreased step length - left, Decreased stance time - right, Decreased stance time - left, Decreased stride length, Trunk flexed, Knee flexed in stance - left, Knee flexed in stance - right, Narrow base of support Gait velocity: decr     General Gait Details: pt amb in room 2x 10', pt required min assist for steady gait and to facilitate anterior lean. pt required asssit to advance walker and able to take longer steps when walker advanced for him.   Stairs             Wheelchair Mobility     Tilt Bed    Modified Rankin (Stroke  Patients Only)       Balance Overall balance assessment: Needs assistance Sitting-balance support: Feet supported Sitting balance-Leahy Scale: Fair Sitting balance - Comments: posterior lean slightly Postural control: Posterior lean Standing balance support: Bilateral upper extremity supported, During functional activity, Reliant on assistive device for balance Standing  balance-Leahy Scale: Poor                              Cognition Arousal/Alertness: Awake/alert Behavior During Therapy: WFL for tasks assessed/performed Overall Cognitive Status: Impaired/Different from baseline Area of Impairment: Problem solving                             Problem Solving: Requires verbal cues, Slow processing, Difficulty sequencing, Decreased initiation          Exercises General Exercises - Lower Extremity Heel Slides: AAROM, Both, 10 reps, Supine Hip ABduction/ADduction: AAROM, Both, 10 reps, Supine Hip Flexion/Marching: AAROM, Both, 10 reps, Supine, Limitations Hip Flexion/Marching Limitations: gentle Int/Ext rot (10x) in flexion as well as gentle knee ext for hamstring stretch (10x)    General Comments        Pertinent Vitals/Pain      Home Living                          Prior Function            PT Goals (current goals can now be found in the care plan section) Acute Rehab PT Goals Patient Stated Goal: to get better PT Goal Formulation: With patient Time For Goal Achievement: 02/24/23 Potential to Achieve Goals: Good Progress towards PT goals: Progressing toward goals    Frequency    Min 1X/week      PT Plan Current plan remains appropriate    Co-evaluation              AM-PAC PT "6 Clicks" Mobility   Outcome Measure  Help needed turning from your back to your side while in a flat bed without using bedrails?: A Little Help needed moving from lying on your back to sitting on the side of a flat bed without using bedrails?: A Little Help needed moving to and from a bed to a chair (including a wheelchair)?: A Lot Help needed standing up from a chair using your arms (e.g., wheelchair or bedside chair)?: A Lot Help needed to walk in hospital room?: A Lot Help needed climbing 3-5 steps with a railing? : Total 6 Click Score: 13    End of Session Equipment Utilized During Treatment: Gait  belt Activity Tolerance: Patient tolerated treatment well;Patient limited by fatigue Patient left: in chair;with call bell/phone within reach;with chair alarm set Nurse Communication: Mobility status PT Visit Diagnosis: Muscle weakness (generalized) (M62.81);Unsteadiness on feet (R26.81)     Time: 0737-1062 PT Time Calculation (min) (ACUTE ONLY): 42 min  Charges:    $Gait Training: 8-22 mins $Therapeutic Exercise: 8-22 mins $Therapeutic Activity: 8-22 mins PT General Charges $$ ACUTE PT VISIT: 1 Visit                      Wynn Maudlin, DPT Acute Rehabilitation Services Office [phone]  02/14/23 4:18 PM

## 2023-02-14 NOTE — TOC Progression Note (Addendum)
Transition of Care [facility]) - Progression Note    Patient Details  Name: Tyler Alvarado MRN: [id number] Date of Birth: Sep 23, 1945  [facility] CM/SW Contact  Merrisa Skorupski A [place], [place] Phone Number: 02/14/2023, 1:56 PM  Clinical Narrative:     Update 1400 CSW reached out to pt's daughter, Naima, to present bed offers.   CSW will send offers to [employer] email address.   CSW provided contact information.   TOC will continue to follow.   CSW made attempt to present bed offers to pt. He was lethargic and could not follow conversation with CSW. CSW to follow up with pt's family member to assist with disposition planning.   TOC will continue to follow. Expected Discharge Plan: Home w Home Health Services Barriers to Discharge: Continued Medical Work up  Expected Discharge Plan and Services In-house Referral: NA Discharge Planning Services: CM Consult Post Acute Care Choice: NA Living arrangements for the past 2 months: Single Family Home                 DME Arranged: N/A DME Agency: NA       HH Arranged: NA           Social Determinants of Health (SDOH) Interventions SDOH Screenings   Food Insecurity: No Food Insecurity (02/09/2023)  Housing: Low Risk  (02/09/2023)  Transportation Needs: No Transportation Needs (02/09/2023)  Utilities: Not At Risk (02/09/2023)  Social Connections: Unknown (02/08/2023)   Received from [facility]  Tobacco Use: Unknown (02/09/2023)    Readmission Risk Interventions     No data to display

## 2023-02-14 NOTE — Plan of Care (Signed)

## 2023-02-15 ENCOUNTER — Other Ambulatory Visit (HOSPITAL_COMMUNITY): Payer: Self-pay

## 2023-02-15 DIAGNOSIS — N390 Urinary tract infection, site not specified: Secondary | ICD-10-CM | POA: Diagnosis not present

## 2023-02-15 DIAGNOSIS — N12 Tubulo-interstitial nephritis, not specified as acute or chronic: Secondary | ICD-10-CM | POA: Diagnosis not present

## 2023-02-15 DIAGNOSIS — U071 COVID-19: Secondary | ICD-10-CM | POA: Diagnosis not present

## 2023-02-15 DIAGNOSIS — A4151 Sepsis due to Escherichia coli [E. coli]: Secondary | ICD-10-CM | POA: Diagnosis not present

## 2023-02-15 MED ORDER — AMOXICILLIN 500 MG PO CAPS
500.0000 mg | ORAL_CAPSULE | Freq: Three times a day (TID) | ORAL | 0 refills | Status: DC
  Filled 2023-02-15: qty 5, 2d supply, fill #0

## 2023-02-15 MED ORDER — LACTATED RINGERS IV BOLUS
1000.0000 mL | Freq: Once | INTRAVENOUS | Status: AC
  Administered 2023-02-15: 1000 mL via INTRAVENOUS
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
## 2023-02-15 NOTE — Plan of Care (Signed)
  Problem: Education: Goal: Knowledge of General Education information will improve Description: Including pain rating scale, medication(s)/side effects and non-pharmacologic comfort measures 02/15/2023 2251 by Tennis Ship, RN Outcome: Progressing 02/15/2023 2104 by Tennis Ship, RN Outcome: Progressing 02/15/2023 2103 by Tennis Ship, RN Outcome: Progressing   Problem: Health Behavior/Discharge Planning: Goal: Ability to manage health-related needs will improve 02/15/2023 2251 by Tennis Ship, RN Outcome: Progressing 02/15/2023 2104 by Tennis Ship, RN Outcome: Progressing 02/15/2023 2103 by Tennis Ship, RN Outcome: Progressing   Problem: Clinical Measurements: Goal: Ability to maintain clinical measurements within normal limits will improve 02/15/2023 2251 by Tennis Ship, RN Outcome: Progressing 02/15/2023 2104 by Tennis Ship, RN Outcome: Progressing 02/15/2023 2103 by Tennis Ship, RN Outcome: Progressing Goal: Will remain free from infection 02/15/2023 2251 by Tennis Ship, RN Outcome: Progressing 02/15/2023 2104 by Tennis Ship, RN Outcome: Progressing 02/15/2023 2103 by Tennis Ship, RN Outcome: Progressing Goal: Diagnostic test results will improve 02/15/2023 2251 by Tennis Ship, RN Outcome: Progressing 02/15/2023 2104 by Tennis Ship, RN Outcome: Progressing 02/15/2023 2103 by Tennis Ship, RN Outcome: Progressing Goal: Respiratory complications will improve 02/15/2023 2251 by Tennis Ship, RN Outcome: Progressing 02/15/2023 2104 by Tennis Ship, RN Outcome: Progressing 02/15/2023 2103 by Tennis Ship, RN Outcome: Progressing Goal: Cardiovascular complication will be avoided 02/15/2023 2251 by Tennis Ship, RN Outcome: Progressing 02/15/2023 2104 by Tennis Ship, RN Outcome: Progressing 02/15/2023 2103 by Tennis Ship, RN Outcome: Progressing   Problem:  Activity: Goal: Risk for activity intolerance will decrease 02/15/2023 2251 by Tennis Ship, RN Outcome: Progressing 02/15/2023 2104 by Tennis Ship, RN Outcome: Progressing 02/15/2023 2103 by Tennis Ship, RN Outcome: Progressing   Problem: Nutrition: Goal: Adequate nutrition will be maintained 02/15/2023 2251 by Tennis Ship, RN Outcome: Progressing 02/15/2023 2104 by Tennis Ship, RN Outcome: Progressing 02/15/2023 2103 by Tennis Ship, RN Outcome: Progressing   Problem: Coping: Goal: Level of anxiety will decrease 02/15/2023 2251 by Tennis Ship, RN Outcome: Progressing 02/15/2023 2104 by Tennis Ship, RN Outcome: Progressing 02/15/2023 2103 by Tennis Ship, RN Outcome: Progressing   Problem: Elimination: Goal: Will not experience complications related to bowel motility 02/15/2023 2251 by Tennis Ship, RN Outcome: Progressing 02/15/2023 2104 by Tennis Ship, RN Outcome: Progressing 02/15/2023 2103 by Tennis Ship, RN Outcome: Progressing Goal: Will not experience complications related to urinary retention 02/15/2023 2251 by Tennis Ship, RN Outcome: Progressing 02/15/2023 2104 by Tennis Ship, RN Outcome: Progressing 02/15/2023 2103 by Tennis Ship, RN Outcome: Progressing   Problem: Pain Managment: Goal: General experience of comfort will improve 02/15/2023 2251 by Tennis Ship, RN Outcome: Progressing 02/15/2023 2104 by Tennis Ship, RN Outcome: Progressing 02/15/2023 2103 by Tennis Ship, RN Outcome: Progressing   Problem: Safety: Goal: Ability to remain free from injury will improve 02/15/2023 2251 by Tennis Ship, RN Outcome: Progressing 02/15/2023 2104 by Tennis Ship, RN Outcome: Progressing 02/15/2023 2103 by Tennis Ship, RN Outcome: Progressing   Problem: Skin Integrity: Goal: Risk for impaired skin integrity will decrease 02/15/2023 2251 by Tennis Ship, RN Outcome: Progressing 02/15/2023 2104 by Tennis Ship, RN Outcome: Progressing 02/15/2023 2103 by Tennis Ship, RN Outcome: Progressing

## 2023-02-15 NOTE — Plan of Care (Signed)

## 2023-02-15 NOTE — Discharge Instructions (Addendum)
You were hospitalized for sepsis due to pyelonephritis. You were treated with IV antibiotics and fluids. Your bloodwork has continued to improve, and overall I feel you are medically stable for discharge from the hospital with outpatient follow up and short term stay at a SNF. Thank you for allowing [place] to be part of your care.   Please make a follow-up appointment with your PCP, Dr. Leilani Able  Please note these changes made to your medications:   *Please continue taking:  Acetaminophen 500mg  as needed for pain Amlodipine 10mg  daily Aspirin 81mg  daily Cyclobenzaprine 10mg  three times daily for pain Lidocaine 4% gel as needed for back pain  Please make sure to return to the ED if your pain worsens or if you develop another fever. Please continue taking your antibiotics until they are completed.   Please call our clinic if you have any questions or concerns, we may be able to help and keep you from a long and expensive emergency room wait. Our clinic and after hours phone number is [phone], the best time to call is Monday through Friday 9 am to 4 pm but there is always someone available 24/7 if you have an emergency. If you need medication refills please notify your pharmacy one week in advance and they will send [place] a request.

## 2023-02-15 NOTE — Progress Notes (Signed)
Occupational Therapy Treatment Patient Details Name: Tyler Alvarado MRN: [id number] DOB: 25-Feb-1946 Today's Date: 02/15/2023   History of present illness Patient is a 77 year old man admitted 7/31 presenting with back pain. Found to have UTI and also reported having developed a nonproductive cough with no shortness of breath and ultimately tested positive for COVID.  PMH:HTN, HLD, keratoconus, congenital nystagmus, corneal scarring,blindness   OT comments  Pt is making fair progress towards their acute OT goals. Per chart review, pt with potential to d/c home however per pt's family the pt is home alone >12 hours a day every day of the week, and had falls at home prior to coming into the hospital. Lengthy discussion completed about discharge options and safety, dtr in agreement that rehab would be the best option after observing therapy session. Overall pt continues to be limited by impaired cognition with poor insight to deficits and safety awareness. He required mod A to stand and min A to take pivotal steps with the RW, pt with notable difficulty moving RLE and needed step by step cues to sequence all tasks. He continues to need mod A for LB ADLs as well. OT to continue to follow acutely to facilitate progress towards established goals. Pt will continue to benefit from skilled inpatient follow up therapy, <3 hours/day for safety and due to lack of caregiver support at home.     Recommendations for follow up therapy are one component of a multi-disciplinary discharge planning process, led by the attending physician.  Recommendations may be updated based on patient status, additional functional criteria and insurance authorization.    Assistance Recommended at Discharge Intermittent Supervision/Assistance  Patient can return home with the following  A little help with walking and/or transfers;A little help with bathing/dressing/bathroom;A lot of help with bathing/dressing/bathroom;Assistance with  cooking/housework;Assist for transportation;Help with stairs or ramp for entrance   Equipment Recommendations  None recommended by OT       Precautions / Restrictions Precautions Precautions: Fall Precaution Comments: COVID Restrictions Weight Bearing Restrictions: No       Mobility Bed Mobility Overal bed mobility: Needs Assistance Bed Mobility: Supine to Sit     Supine to sit: Min assist          Transfers Overall transfer level: Needs assistance Equipment used: Rolling walker (2 wheels) Transfers: Sit to/from Stand, Bed to chair/wheelchair/BSC Sit to Stand: Mod assist     Step pivot transfers: Min assist     General transfer comment: step by step cues needed, notable difficulty with movement of RLE     Balance Overall balance assessment: Needs assistance Sitting-balance support: Feet supported Sitting balance-Leahy Scale: Fair     Standing balance support: Bilateral upper extremity supported, During functional activity, Reliant on assistive device for balance Standing balance-Leahy Scale: Poor                             ADL either performed or assessed with clinical judgement   ADL Overall ADL's : Needs assistance/impaired     Grooming: Set up;Sitting               Lower Body Dressing: Moderate assistance;Sit to/from stand   Toilet Transfer: Moderate assistance;Stand-pivot;Rolling walker (2 wheels) Toilet Transfer Details (indicate cue type and reason): simulated to chair, mod A to stand, min A to take pivotal steps         Functional mobility during ADLs: Moderate assistance;Rolling walker (2 wheels) General ADL  Comments: impiared cognition and very poor insight to deficits and safety. Pt having a lot of difficultly moving RLE, but he perseverated on his LLE    Extremity/Trunk Assessment Upper Extremity Assessment Upper Extremity Assessment: RUE deficits/detail   Lower Extremity Assessment Lower Extremity Assessment:  Generalized weakness        Vision   Vision Assessment?: Vision impaired- to be further tested in functional context Additional Comments: low vision at baseline. Able to see shadows and shapes   Perception Perception Perception: Not tested   Praxis Praxis Praxis: Not tested    Cognition Arousal/Alertness: Awake/alert Behavior During Therapy: WFL for tasks assessed/performed Overall Cognitive Status: Impaired/Different from baseline Area of Impairment: Following commands, Safety/judgement, Awareness, Problem solving                       Following Commands: Follows one step commands with increased time Safety/Judgement: Decreased awareness of safety, Decreased awareness of deficits Awareness: Emergent Problem Solving: Slow processing, Difficulty sequencing, Requires verbal cues General Comments: needs step by step cues, has difficulty with problem solving. Poor insight to deficits and safety, does not understand the safety implications of going home alone at his current functional status              General Comments VSS, dtr present    Pertinent Vitals/ Pain       Pain Assessment Pain Assessment: Faces Faces Pain Scale: Hurts a little bit Pain Location: LLE Pain Descriptors / Indicators: Discomfort Pain Intervention(s): Limited activity within patient's tolerance, Monitored during session         Frequency  Min 1X/week        Progress Toward Goals  OT Goals(current goals can now be found in the care plan section)  Progress towards OT goals: Progressing toward goals  Acute Rehab OT Goals Patient Stated Goal: to go home OT Goal Formulation: With patient Time For Goal Achievement: 02/25/23 Potential to Achieve Goals: Good ADL Goals Pt Will Perform Eating: with set-up;sitting Pt Will Perform Grooming: with set-up;sitting Pt Will Perform Upper Body Dressing: with set-up;sitting Pt Will Perform Lower Body Dressing: with set-up;with min guard  assist;sit to/from stand Pt Will Transfer to Toilet: with min guard assist;ambulating  Plan Discharge plan remains appropriate       AM-PAC OT "6 Clicks" Daily Activity     Outcome Measure   Help from another person eating meals?: A Little Help from another person taking care of personal grooming?: A Little Help from another person toileting, which includes using toliet, bedpan, or urinal?: A Lot Help from another person bathing (including washing, rinsing, drying)?: A Little Help from another person to put on and taking off regular upper body clothing?: A Little Help from another person to put on and taking off regular lower body clothing?: A Lot 6 Click Score: 16    End of Session Equipment Utilized During Treatment: Gait belt;Rolling walker (2 wheels)  OT Visit Diagnosis: Unsteadiness on feet (R26.81);Muscle weakness (generalized) (M62.81);Low vision, both eyes (H54.2)   Activity Tolerance Patient tolerated treatment well   Patient Left in chair;with call bell/phone within reach;with chair alarm set;with family/visitor present   Nurse Communication Mobility status        Time: 1610-9604 OT Time Calculation (min): 30 min  Charges: OT General Charges $OT Visit: 1 Visit OT Treatments $Therapeutic Activity: 23-37 mins  Derenda Mis, OTR/L Acute Rehabilitation Services Office [phone] Secure Chat Communication Preferred   Donia Pounds 02/15/2023, 12:57 PM

## 2023-02-16 ENCOUNTER — Other Ambulatory Visit (HOSPITAL_COMMUNITY): Payer: Self-pay

## 2023-02-16 DIAGNOSIS — A419 Sepsis, unspecified organism: Secondary | ICD-10-CM | POA: Diagnosis not present

## 2023-02-16 DIAGNOSIS — N12 Tubulo-interstitial nephritis, not specified as acute or chronic: Secondary | ICD-10-CM | POA: Diagnosis not present

## 2023-02-16 DIAGNOSIS — U071 COVID-19: Secondary | ICD-10-CM | POA: Diagnosis not present

## 2023-02-16 DIAGNOSIS — N390 Urinary tract infection, site not specified: Secondary | ICD-10-CM | POA: Diagnosis not present

## 2023-02-16 LAB — BASIC METABOLIC PANEL WITH GFR
Anion gap: 13 (ref 5–15)
BUN: 12 mg/dL (ref 8–23)
CO2: 24 mmol/L (ref 22–32)
Calcium: 8.1 mg/dL — ABNORMAL LOW (ref 8.9–10.3)
Chloride: 95 mmol/L — ABNORMAL LOW (ref 98–111)
Creatinine, Ser: 0.68 mg/dL (ref 0.61–1.24)
GFR, Estimated: >60 mL/min
Glucose, Bld: 91 mg/dL (ref 70–99)
Potassium: 3.7 mmol/L (ref 3.5–5.1)
Sodium: 132 mmol/L — ABNORMAL LOW (ref 135–145)

## 2023-02-16 MED ORDER — AMOXICILLIN 500 MG PO CAPS
500.0000 mg | ORAL_CAPSULE | Freq: Three times a day (TID) | ORAL | 0 refills | Status: AC
  Filled 2023-02-16: qty 3, 1d supply, fill #0

## 2023-02-16 MED ORDER — PHENYLEPHRINE HCL-NACL 20-0.9 MG/250ML-% IV SOLN
INTRAVENOUS | Status: AC
  Filled 2023-02-16: qty 250

## 2023-02-16 NOTE — Plan of Care (Signed)

## 2023-02-16 NOTE — Progress Notes (Signed)
Physical Therapy Treatment Patient Details Name: Tyler Alvarado MRN: [id number] DOB: 08/24/1945 Today's Date: 02/16/2023   History of Present Illness Patient is a 77 year old man admitted 7/31 presenting with back pain. Found to have UTI and also reported having developed a nonproductive cough with no shortness of breath and ultimately tested positive for COVID.  PMH:HTN, HLD, keratoconus, congenital nystagmus, corneal scarring,blindness    PT Comments  Pt greeted resting in bed and eager for OOB mobility. Pt able to complete bed mobility with min A to sequence rail use and elevate trunk. Pt able to progress transfers and gait with RW support and up to min A to steady and mod A needed for navigation to<>from bathroom and RW management due to pt vision deficits. Pt declining further gait and exercise due to fatigue. Current plan remains appropriate to address deficits and maximize functional independence and decrease caregiver burden. Pt continues to benefit from skilled PT services to progress toward functional mobility goals.      If plan is discharge home, recommend the following: A lot of help with walking and/or transfers;A lot of help with bathing/dressing/bathroom;Assistance with cooking/housework;Direct supervision/assist for medications management;Direct supervision/assist for financial management;Assist for transportation;Help with stairs or ramp for entrance   Can travel by private vehicle     No  Equipment Recommendations  Rolling walker (2 wheels)    Recommendations for Other Services       Precautions / Restrictions Precautions Precautions: Fall Precaution Comments: COVID Restrictions Weight Bearing Restrictions: No     Mobility  Bed Mobility Overal bed mobility: Needs Assistance Bed Mobility: Supine to Sit     Supine to sit: Min assist     General bed mobility comments: cues to place bil UE on bed rail. pt able to bring LEs to and off bed without cues, Pt able to  initiate raising trunk and min assist to raise fully upright.    Transfers Overall transfer level: Needs assistance Equipment used: Rolling walker (2 wheels) Transfers: Sit to/from Stand, Bed to chair/wheelchair/BSC Sit to Stand: Min assist, Contact guard assist           General transfer comment: min A to rise from slightly elevated EOB initially, CGA to rise from low commode    Ambulation/Gait Ambulation/Gait assistance: Mod assist Gait Distance (Feet): 20 Feet (x2) Assistive device: Rolling walker (2 wheels), IV Pole Gait Pattern/deviations: Decreased step length - right, Decreased step length - left, Decreased stance time - right, Decreased stance time - left, Decreased stride length, Trunk flexed, Knee flexed in stance - left, Knee flexed in stance - right, Narrow base of support, Step-through pattern Gait velocity: decr     General Gait Details: 2x 20' to<>from bathroom, pt required mod asssit to advance walker and for navigation as pt with very low/zero vision, able to take longer steps when walker advanced for him. cues for upright trunk and wider BOS   Stairs             Wheelchair Mobility     Tilt Bed    Modified Rankin (Stroke Patients Only)       Balance Overall balance assessment: Needs assistance Sitting-balance support: Feet supported Sitting balance-Leahy Scale: Fair     Standing balance support: Bilateral upper extremity supported, During functional activity, Reliant on assistive device for balance Standing balance-Leahy Scale: Poor  Cognition Arousal: Alert Behavior During Therapy: WFL for tasks assessed/performed Overall Cognitive Status: Impaired/Different from baseline Area of Impairment: Following commands, Safety/judgement, Awareness, Problem solving                       Following Commands: Follows one step commands with increased time Safety/Judgement: Decreased awareness of  safety, Decreased awareness of deficits Awareness: Emergent Problem Solving: Slow processing, Difficulty sequencing, Requires verbal cues General Comments: needs step by step cues, has difficulty with problem solving. Poor insight to deficits and safety.        Exercises Other Exercises Other Exercises: pt declining further exercise after x2 gait bouts due to fatigue    General Comments General comments (skin integrity, edema, etc.): VSS on RA      Pertinent Vitals/Pain Pain Assessment Pain Assessment: Faces Faces Pain Scale: Hurts a little bit Pain Location: generalized Pain Descriptors / Indicators: Discomfort, Grimacing, Guarding Pain Intervention(s): Monitored during session, Limited activity within patient's tolerance, Repositioned    Home Living                          Prior Function            PT Goals (current goals can now be found in the care plan section) Acute Rehab PT Goals Patient Stated Goal: to get better PT Goal Formulation: With patient Time For Goal Achievement: 02/24/23 Progress towards PT goals: Progressing toward goals    Frequency    Min 1X/week      PT Plan Current plan remains appropriate    Co-evaluation              AM-PAC PT "6 Clicks" Mobility   Outcome Measure  Help needed turning from your back to your side while in a flat bed without using bedrails?: A Little Help needed moving from lying on your back to sitting on the side of a flat bed without using bedrails?: A Little Help needed moving to and from a bed to a chair (including a wheelchair)?: A Lot Help needed standing up from a chair using your arms (e.g., wheelchair or bedside chair)?: A Lot Help needed to walk in hospital room?: A Lot Help needed climbing 3-5 steps with a railing? : Total 6 Click Score: 13    End of Session Equipment Utilized During Treatment: Gait belt Activity Tolerance: Patient tolerated treatment well;Patient limited by  fatigue Patient left: in chair;with call bell/phone within reach;with chair alarm set Nurse Communication: Mobility status PT Visit Diagnosis: Muscle weakness (generalized) (M62.81);Unsteadiness on feet (R26.81)     Time: 1610-9604 PT Time Calculation (min) (ACUTE ONLY): 36 min  Charges:    $Gait Training: 8-22 mins $Therapeutic Activity: 8-22 mins PT General Charges $$ ACUTE PT VISIT: 1 Visit                     Lowry Bala R. PTA Acute Rehabilitation Services Office: [phone]   Catalina Antigua 02/16/2023, 10:53 AM

## 2023-02-16 NOTE — Progress Notes (Addendum)
Report called to [facility] and rehab  Nurse name Lauris Poag, [place]

## 2023-02-16 NOTE — TOC Progression Note (Signed)
Transition of Care [facility]) - Progression Note    Patient Details  Name: EDISSON PAVLOVICH MRN: [id number] Date of Birth: 01-11-1946  [facility] of [facility]) CM/SW Contact  Britiney Blahnik A [place], [place] Phone Number: 02/16/2023, 9:05 AM  Clinical Narrative:     Pt's Berkley Harvey was approved.  Berkley Harvey ID [id number] Reference ID [id number]  02/15/2023-02/17/2023  CSW will follow up with Phineas Semen to confirm bed available today.   TOC will continue to follow.   Expected Discharge Plan: Home w Home Health Services Barriers to Discharge: Continued Medical Work up  Expected Discharge Plan and Services In-house Referral: NA Discharge Planning Services: CM Consult Post Acute Care Choice: NA Living arrangements for the past 2 months: Single Family Home Expected Discharge Date: 02/15/23               DME Arranged: N/A DME Agency: NA       HH Arranged: NA           Social Determinants of Health (SDOH) Interventions SDOH Screenings   Food Insecurity: No Food Insecurity (02/09/2023)  Housing: Low Risk  (02/09/2023)  Transportation Needs: No Transportation Needs (02/09/2023)  Utilities: Not At Risk (02/09/2023)  Social Connections: Unknown (02/08/2023)   Received from [facility]  Tobacco Use: Unknown (02/09/2023)    Readmission Risk Interventions     No data to display

## 2023-02-16 NOTE — TOC Progression Note (Signed)
Transition of Care [facility]) - Progression Note    Patient Details  Name: Tyler Alvarado MRN: [id number] Date of Birth: [birth year]  Transition of [facility]) CM/SW Contact  Lakeyia Surber A [place], [place] Phone Number: 02/16/2023, 10:42 AM  Clinical Narrative:     Patient will DC to: [facility]   Anticipated DC date: 02/26/23  Family notified: Naima   Transport by: Sharin Mons      Per MD patient ready for DC to [facility]. RN, patient, patient's family, and facility notified of DC. Discharge Summary and FL2 sent to facility. RN to call report prior to discharge (room 307, report # [phone] ). DC packet on chart. Ambulance transport requested for patient.     CSW will sign off for now as social work intervention is no longer needed. Please consult [place] again if new needs arise.   Expected Discharge Plan: Home w Home Health Services Barriers to Discharge: Barriers Resolved  Expected Discharge Plan and Services In-house Referral: NA Discharge Planning Services: CM Consult Post Acute Care Choice: NA Living arrangements for the past 2 months: Single Family Home Expected Discharge Date: 02/16/23               DME Arranged: N/A DME Agency: NA       HH Arranged: NA           Social Determinants of Health (SDOH) Interventions SDOH Screenings   Food Insecurity: No Food Insecurity (02/09/2023)  Housing: Low Risk  (02/09/2023)  Transportation Needs: No Transportation Needs (02/09/2023)  Utilities: Not At Risk (02/09/2023)  Social Connections: Unknown (02/08/2023)   Received from [facility]  Tobacco Use: Unknown (02/09/2023)    Readmission Risk Interventions     No data to display

## 2023-05-11 ENCOUNTER — Ambulatory Visit: Payer: Medicare Other | Admitting: Family

## 2023-05-18 ENCOUNTER — Ambulatory Visit: Payer: Self-pay | Admitting: Family

## 2023-05-19 ENCOUNTER — Ambulatory Visit (INDEPENDENT_AMBULATORY_CARE_PROVIDER_SITE_OTHER): Payer: Medicare Other | Admitting: Adult Health

## 2023-05-19 ENCOUNTER — Encounter: Payer: Self-pay | Admitting: Adult Health

## 2023-05-19 VITALS — BP 138/78 | HR 101 | Temp 98.8°F | Resp 18 | Ht 67.0 in | Wt 170.0 lb

## 2023-05-19 DIAGNOSIS — Z7689 Persons encountering health services in other specified circumstances: Secondary | ICD-10-CM

## 2023-05-19 DIAGNOSIS — I10 Essential (primary) hypertension: Secondary | ICD-10-CM | POA: Diagnosis not present

## 2023-05-19 DIAGNOSIS — E785 Hyperlipidemia, unspecified: Secondary | ICD-10-CM | POA: Diagnosis not present

## 2023-05-19 DIAGNOSIS — Z8673 Personal history of transient ischemic attack (TIA), and cerebral infarction without residual deficits: Secondary | ICD-10-CM

## 2023-05-19 DIAGNOSIS — Z113 Encounter for screening for infections with a predominantly sexual mode of transmission: Secondary | ICD-10-CM

## 2023-05-19 DIAGNOSIS — Z2821 Immunization not carried out because of patient refusal: Secondary | ICD-10-CM

## 2023-05-19 DIAGNOSIS — Z125 Encounter for screening for malignant neoplasm of prostate: Secondary | ICD-10-CM

## 2023-05-19 DIAGNOSIS — Z131 Encounter for screening for diabetes mellitus: Secondary | ICD-10-CM

## 2023-05-19 NOTE — Patient Instructions (Addendum)
Preventive Care 65 Years and Older, Male Preventive care refers to lifestyle choices and visits with your health care provider that can promote health and wellness. Preventive care visits are also called wellness exams. What can I expect for my preventive care visit? Counseling During your preventive care visit, your health care provider may ask about your: Medical history, including: Past medical problems. Family medical history. History of falls. Current health, including: Emotional well-being. Home life and relationship well-being. Sexual activity. Memory and ability to understand (cognition). Lifestyle, including: Alcohol, nicotine or tobacco, and drug use. Access to firearms. Diet, exercise, and sleep habits. Work and work environment. Sunscreen use. Safety issues such as seatbelt and bike helmet use. Physical exam Your health care provider will check your: Height and weight. These may be used to calculate your BMI (body mass index). BMI is a measurement that tells if you are at a healthy weight. Waist circumference. This measures the distance around your waistline. This measurement also tells if you are at a healthy weight and may help predict your risk of certain diseases, such as type 2 diabetes and high blood pressure. Heart rate and blood pressure. Body temperature. Skin for abnormal spots. What immunizations do I need?  Vaccines are usually given at various ages, according to a schedule. Your health care provider will recommend vaccines for you based on your age, medical history, and lifestyle or other factors, such as travel or where you work. What tests do I need? Screening Your health care provider may recommend screening tests for certain conditions. This may include: Lipid and cholesterol levels. Diabetes screening. This is done by checking your blood sugar (glucose) after you have not eaten for a while (fasting). Hepatitis C test. Hepatitis B test. HIV (human  immunodeficiency virus) test. STI (sexually transmitted infection) testing, if you are at risk. Lung cancer screening. Colorectal cancer screening. Prostate cancer screening. Abdominal aortic aneurysm (AAA) screening. You may need this if you are a current or former smoker. Talk with your health care provider about your test results, treatment options, and if necessary, the need for more tests. Follow these instructions at home: Eating and drinking  Eat a diet that includes fresh fruits and vegetables, whole grains, lean protein, and low-fat dairy products. Limit your intake of foods with high amounts of sugar, saturated fats, and salt. Take vitamin and mineral supplements as recommended by your health care provider. Do not drink alcohol if your health care provider tells you not to drink. If you drink alcohol: Limit how much you have to 0-2 drinks a day. Know how much alcohol is in your drink. In the [place] one drink equals one 12 oz bottle of beer (355 mL), one 5 oz glass of wine (148 mL), or one 1 oz glass of hard liquor (44 mL). Lifestyle Brush your teeth every morning and night with fluoride toothpaste. Floss one time each day. Exercise for at least 30 minutes 5 or more days each week. Do not use any products that contain nicotine or tobacco. These products include cigarettes, chewing tobacco, and vaping devices, such as e-cigarettes. If you need help quitting, ask your health care provider. Do not use drugs. If you are sexually active, practice safe sex. Use a condom or other form of protection to prevent STIs. Take aspirin only as told by your health care provider. Make sure that you understand how much to take and what form to take. Work with your health care provider to find out whether it is safe   and beneficial for you to take aspirin daily. Ask your health care provider if you need to take a cholesterol-lowering medicine (statin). Find healthy ways to manage stress, such  as: Meditation, yoga, or listening to music. Journaling. Talking to a trusted person. Spending time with friends and family. Safety Always wear your seat belt while driving or riding in a vehicle. Do not drive: If you have been drinking alcohol. Do not ride with someone who has been drinking. When you are tired or distracted. While texting. If you have been using any mind-altering substances or drugs. Wear a helmet and other protective equipment during sports activities. If you have firearms in your house, make sure you follow all gun safety procedures. Minimize exposure to UV radiation to reduce your risk of skin cancer. What's next? Visit your health care provider once a year for an annual wellness visit. Ask your health care provider how often you should have your eyes and teeth checked. Stay up to date on all vaccines. This information is not intended to replace advice given to you by your health care provider. Make sure you discuss any questions you have with your health care provider. Document Revised: 12/24/2020 Document Reviewed: 12/24/2020 Elsevier Patient Education  2024 [employer]  

## 2023-05-19 NOTE — Progress Notes (Signed)
[facility] clinic  Provider:   Code Status:  Full Code  Goals of Care:     05/19/2023    1:13 PM  Advanced Directives  Does Patient Have a Medical Advance Directive? No  Would patient like information on creating a medical advance directive? No - Patient declined     Chief Complaint  Patient presents with   Establish Care    new patient    HPI: Patient is a 77 y.o. male seen today to establish care with [facility]. He was accompanied by his male companion and daughter, Samule Ohm. He was sitting on a wheelchair but daughter stated that he can walk in the house while grabbing on furniture. 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 He is alert and oriented X 3.   He has a son, 50 Y/O, and daughter, 73 Y/O.Marland Kitchen He completed 1 year of college. He used to work as a Technical brewer.Marland Kitchen He exercises daily. He has a 2-level house and lives with his male companion.     Past Medical History:  Diagnosis Date   Hypertension    Stroke [facility])     Past Surgical History:  Procedure Laterality Date   EYE SURGERY      Allergies  Allergen Reactions   Pork-Derived Products Other (See Comments)    Pt does not consume pork products    Outpatient Encounter Medications as of 05/19/2023  Medication Sig   amLODipine (NORVASC) 10 MG tablet Take 10 mg by mouth daily.   aspirin EC 81 MG EC tablet Take 1 tablet (81 mg total) by mouth daily. Swallow whole.   [DISCONTINUED] cyclobenzaprine (FLEXERIL) 10 MG tablet Take 10 mg by mouth 3 (three) times daily.   acetaminophen (TYLENOL) 500 MG tablet Take 1,000 mg by mouth 2 (two) times daily as needed for moderate pain. (Patient not taking: Reported on 05/19/2023)   Lidocaine 4 % GEL Apply 1 application  topically 2 (two) times daily as needed (back pain). (Patient not taking: Reported on 05/19/2023)   No facility-administered encounter medications on file as of 05/19/2023.    Review of Systems:  Review of Systems  Constitutional:  Negative  for activity change, appetite change and fever.  HENT:  Negative for sore throat.   Eyes: Negative.   Cardiovascular:  Negative for chest pain and leg swelling.  Gastrointestinal:  Negative for abdominal distention, diarrhea and vomiting.  Genitourinary:  Negative for dysuria, frequency and urgency.  Skin:  Negative for color change.  Neurological:  Negative for dizziness and headaches.  Psychiatric/Behavioral:  Negative for behavioral problems and sleep disturbance. The patient is not nervous/anxious.     Health Maintenance  Topic Date Due   Medicare Annual Wellness (AWV)  Never done   Hepatitis C Screening  Never done   COVID-19 Vaccine (1 - 2023-24 season) 06/04/2023 (Originally 03/13/2023)   Zoster Vaccines- Shingrix (1 of 2) 08/19/2023 (Originally 08/17/1996)   INFLUENZA VACCINE  10/10/2023 (Originally 02/10/2023)   DTaP/Tdap/Td (1 - Tdap) 05/18/2024 (Originally 08/17/1965)   Pneumonia Vaccine 33+ Years old (1 of 1 - PCV) 05/18/2024 (Originally 08/18/2011)   HPV VACCINES  Aged Out    Physical Exam: Vitals:   05/19/23 1314  BP: 138/78  Pulse: (!) 101  Resp: 18  Temp: 98.8 F (37.1 C)  SpO2: 99%  Weight: 170 lb (77.1 kg)  Height: 5\' 7"  (1.702 m)   Body mass index is 26.63 kg/m. Physical Exam Constitutional:      General:  He is not in acute distress.    Appearance: Normal appearance.  HENT:     Head: Normocephalic and atraumatic.     Mouth/Throat:     Mouth: Mucous membranes are moist.  Eyes:     Comments: Blind on both eyes   Cardiovascular:     Rate and Rhythm: Normal rate and regular rhythm.     Pulses: Normal pulses.     Heart sounds: Normal heart sounds.  Pulmonary:     Effort: Pulmonary effort is normal.     Breath sounds: Normal breath sounds.  Abdominal:     General: Bowel sounds are normal.     Palpations: Abdomen is soft.  Musculoskeletal:        General: No swelling.     Cervical back: Normal range of motion.  Skin:    General: Skin is warm and dry.   Neurological:     General: No focal deficit present.     Mental Status: He is alert and oriented to person, place, and time.     Comments: Right upper and lower extremity weakness.  Psychiatric:        Mood and Affect: Mood normal.        Behavior: Behavior normal.        Thought Content: Thought content normal.        Judgment: Judgment normal.     Labs reviewed: Basic Metabolic Panel: Recent Labs    02/14/23 0417 02/15/23 0006 02/16/23 0418  NA 129* 128* 132*  K 4.0 3.7 3.7  CL 96* 94* 95*  CO2 22 23 24   GLUCOSE 87 88 91  BUN 13 10 12   CREATININE 0.73 0.71 0.68  CALCIUM 8.0* 7.8* 8.1*   Liver Function Tests: Recent Labs    02/12/23 0451 02/14/23 0417 02/15/23 0006  AST 43* 62* 55*  ALT 31 39 39  ALKPHOS 55 68 61  BILITOT 0.6 0.9 0.6  PROT 6.9 7.6 7.0  ALBUMIN 2.4* 2.3* 2.0*   No results for input(s): "LIPASE", "AMYLASE" in the last 8760 hours. No results for input(s): "AMMONIA" in the last 8760 hours. CBC: Recent Labs    02/09/23 1043 02/10/23 0726 02/14/23 0417 02/15/23 0006 02/16/23 0418  WBC 4.7   < > 3.1* 3.4* 4.1  NEUTROABS 3.3  --   --   --   --   HGB 11.7*   < > 13.0 11.8* 11.3*  HCT 36.4*   < > 39.3 35.2* 34.2*  MCV 89.4   < > 89.1 88.4 87.7  PLT 212   < > 212 259 291   < > = values in this interval not displayed.   Lipid Panel: No results for input(s): "CHOL", "HDL", "LDLCALC", "TRIG", "CHOLHDL", "LDLDIRECT" in the last 8760 hours. Lab Results  Component Value Date   HGBA1C 6.0 (H) 07/26/2021    Procedures since last visit: No results found.  Assessment/Plan  1. Encounter to establish care -   Established care with [facility]  2. Essential hypertension -   BP 137/78, stable -     Continue amlodipine -     Monitor BP, log and bring to next appointment - Complete Metabolic Panel with eGFR; Future - CBC With Differential/Platelet; Future  3. Hyperlipidemia, unspecified hyperlipidemia type Lab Results  Component Value Date   CHOL  190 07/26/2021   HDL 43 07/26/2021   LDLCALC 137 (H) 07/26/2021   TRIG 51 07/26/2021   CHOLHDL 4.4 07/26/2021    -   not  on statin - Lipid panel; Future  4. Screen for STD (sexually transmitted disease) - Hep C Antibody; Future  5. Flu vaccine refused -    Declined flu vaccine  6. Screening for prostate cancer - PSA; Future  7. Screening for diabetes mellitus - Hemoglobin A1C; Future  8. History of CVA -  stable -  continue ASA 81 mg daily      Labs/tests ordered:  CBC, CMP, lipid panel, A1c, hep C antibody  Next appt:  Visit date not found

## 2023-05-20 ENCOUNTER — Other Ambulatory Visit: Payer: Medicare Other

## 2023-05-20 DIAGNOSIS — I10 Essential (primary) hypertension: Secondary | ICD-10-CM

## 2023-05-20 DIAGNOSIS — Z125 Encounter for screening for malignant neoplasm of prostate: Secondary | ICD-10-CM

## 2023-05-20 DIAGNOSIS — E785 Hyperlipidemia, unspecified: Secondary | ICD-10-CM

## 2023-05-20 DIAGNOSIS — Z131 Encounter for screening for diabetes mellitus: Secondary | ICD-10-CM

## 2023-05-20 DIAGNOSIS — Z113 Encounter for screening for infections with a predominantly sexual mode of transmission: Secondary | ICD-10-CM

## 2023-05-21 LAB — COMPLETE METABOLIC PANEL WITH GFR
AG Ratio: 0.9 (calc) — ABNORMAL LOW (ref 1.0–2.5)
ALT: 9 U/L (ref 9–46)
AST: 12 U/L (ref 10–35)
Albumin: 3.7 g/dL (ref 3.6–5.1)
Alkaline phosphatase (APISO): 78 U/L (ref 35–144)
BUN: 14 mg/dL (ref 7–25)
CO2: 25 mmol/L (ref 20–32)
Calcium: 9.3 mg/dL (ref 8.6–10.3)
Chloride: 106 mmol/L (ref 98–110)
Creat: 0.78 mg/dL (ref 0.70–1.28)
Globulin: 4 g/dL — ABNORMAL HIGH (ref 1.9–3.7)
Glucose, Bld: 87 mg/dL (ref 65–99)
Potassium: 3.8 mmol/L (ref 3.5–5.3)
Sodium: 141 mmol/L (ref 135–146)
Total Bilirubin: 0.4 mg/dL (ref 0.2–1.2)
Total Protein: 7.7 g/dL (ref 6.1–8.1)
eGFR: 92 mL/min/{1.73_m2} (ref >=60)

## 2023-05-21 LAB — CBC WITH DIFFERENTIAL/PLATELET
Absolute Lymphocytes: 1297 {cells}/uL (ref 850–3900)
Absolute Monocytes: 277 {cells}/uL (ref 200–950)
Basophils Absolute: 28 {cells}/uL (ref 0–200)
Basophils Relative: 0.6 %
Eosinophils Absolute: 19 {cells}/uL (ref 15–500)
Eosinophils Relative: 0.4 %
HCT: 35.4 % — ABNORMAL LOW (ref 38.5–50.0)
Hemoglobin: 11.3 g/dL — ABNORMAL LOW (ref 13.2–17.1)
MCH: 29.1 pg (ref 27.0–33.0)
MCHC: 31.9 g/dL — ABNORMAL LOW (ref 32.0–36.0)
MCV: 91.2 fL (ref 80.0–100.0)
MPV: 9.9 fL (ref 7.5–12.5)
Monocytes Relative: 5.9 %
Neutro Abs: 3079 {cells}/uL (ref 1500–7800)
Neutrophils Relative %: 65.5 %
Platelets: 308 10*3/uL (ref 140–400)
RBC: 3.88 10*6/uL — ABNORMAL LOW (ref 4.20–5.80)
RDW: 14.3 % (ref 11.0–15.0)
Total Lymphocyte: 27.6 %
WBC: 4.7 10*3/uL (ref 3.8–10.8)

## 2023-05-21 LAB — LIPID PANEL
Cholesterol: 168 mg/dL (ref <200)
HDL: 50 mg/dL (ref >=40)
LDL Cholesterol (Calc): 105 mg/dL — ABNORMAL HIGH
Non-HDL Cholesterol (Calc): 118 mg/dL (ref <130)
Total CHOL/HDL Ratio: 3.4 (calc) (ref <5.0)
Triglycerides: 43 mg/dL (ref <150)

## 2023-05-21 LAB — HEMOGLOBIN A1C
Hgb A1c MFr Bld: 5.4 %{Hb} (ref <5.7)
Mean Plasma Glucose: 108 mg/dL
eAG (mmol/L): 6 mmol/L

## 2023-05-21 LAB — PSA: PSA: 0.69 ng/mL (ref <=4.00)

## 2023-05-21 LAB — HEPATITIS C ANTIBODY: Hepatitis C Ab: NONREACTIVE

## 2023-06-01 NOTE — Progress Notes (Signed)
-     LDL 105, slightly elevated with normal less than 100 -   Hemoglobin 11.3, same as previous hemoglobin 11.3 (02/16/2023) -   Electrolytes, kidney function, liver enzymes, A1c, PSA normal -    Hep C antibody negative

## 2023-06-02 ENCOUNTER — Encounter: Payer: Self-pay | Admitting: Adult Health

## 2023-06-02 ENCOUNTER — Ambulatory Visit (INDEPENDENT_AMBULATORY_CARE_PROVIDER_SITE_OTHER): Payer: Medicare Other | Admitting: Adult Health

## 2023-06-02 VITALS — BP 122/78 | HR 80 | Temp 97.5°F | Resp 18 | Ht 67.0 in | Wt 161.0 lb

## 2023-06-02 DIAGNOSIS — I69359 Hemiplegia and hemiparesis following cerebral infarction affecting unspecified side: Secondary | ICD-10-CM | POA: Diagnosis not present

## 2023-06-02 DIAGNOSIS — E785 Hyperlipidemia, unspecified: Secondary | ICD-10-CM | POA: Diagnosis not present

## 2023-06-02 DIAGNOSIS — D509 Iron deficiency anemia, unspecified: Secondary | ICD-10-CM | POA: Diagnosis not present

## 2023-06-02 DIAGNOSIS — I10 Essential (primary) hypertension: Secondary | ICD-10-CM | POA: Diagnosis not present

## 2023-06-02 DIAGNOSIS — Z8673 Personal history of transient ischemic attack (TIA), and cerebral infarction without residual deficits: Secondary | ICD-10-CM

## 2023-06-02 DIAGNOSIS — R531 Weakness: Secondary | ICD-10-CM

## 2023-06-02 MED ORDER — ATORVASTATIN CALCIUM 40 MG PO TABS: 40.0000 mg | ORAL_TABLET | Freq: Every day | ORAL | 1 refills | Status: DC

## 2023-06-02 MED ORDER — MULTI-VITAMIN/MINERALS PO TABS
1.0000 | ORAL_TABLET | Freq: Every day | ORAL | Status: AC
Start: 2023-06-02 — End: ?

## 2023-06-02 NOTE — Progress Notes (Signed)
[facility] clinic  Provider:  Kenard Gower DNP  Code Status:  Full Code  Goals of Care:     05/19/2023    1:13 PM  Advanced Directives  Does Patient Have a Medical Advance Directive? No  Would patient like information on creating a medical advance directive? No - Patient declined     Chief Complaint  Patient presents with   Medical Management of Chronic Issues    2 weeks follow-up   Health Maintenance    Medicare Annual Wellness Visit    HPI: Patient is a 77 y.o. male seen today for medical management of chronic issues. He was accompanied by his daughter, Naima.  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  -  takes ASA 81 mg  Hyperlipidemia, unspecified hyperlipidemia type -   chol 168, triglycerides 43, LDL 105  Essential hypertension -  BP 122/78, takes Amlodipine  Iron deficiency anemia, unspecified iron deficiency anemia type -  hgb  11.3, not supplementation   Wt Readings from Last 3 Encounters:  06/02/23 161 lb (73 kg)  05/19/23 170 lb (77.1 kg)  02/15/23 173 lb 9.6 oz (78.7 kg)     Past Medical History:  Diagnosis Date   Hypertension    Stroke [facility]     Past Surgical History:  Procedure Laterality Date   EYE SURGERY      Allergies  Allergen Reactions   Pork-Derived Products Other (See Comments)    Pt does not consume pork products    Outpatient Encounter Medications as of 06/02/2023  Medication Sig   amLODipine (NORVASC) 10 MG tablet Take 10 mg by mouth daily.   aspirin EC 81 MG EC tablet Take 1 tablet (81 mg total) by mouth daily. Swallow whole.   acetaminophen (TYLENOL) 500 MG tablet Take 1,000 mg by mouth 2 (two) times daily as needed for moderate pain. (Patient not taking: Reported on 05/19/2023)   Lidocaine 4 % GEL Apply 1 application  topically 2 (two) times daily as needed (back pain). (Patient not taking: Reported on 05/19/2023)   No facility-administered encounter medications on file as of  06/02/2023.    Review of Systems:  Review of Systems  Constitutional:  Negative for activity change, appetite change and fever.  HENT:  Negative for sore throat.   Eyes: Negative.   Cardiovascular:  Negative for chest pain and leg swelling.  Gastrointestinal:  Negative for abdominal distention, diarrhea and vomiting.  Genitourinary:  Negative for dysuria, frequency and urgency.  Skin:  Negative for color change.  Neurological:  Negative for dizziness and headaches.  Psychiatric/Behavioral:  Negative for behavioral problems and sleep disturbance. The patient is not nervous/anxious.     Health Maintenance  Topic Date Due   Medicare Annual Wellness (AWV)  Never done   COVID-19 Vaccine (4 - 2023-24 season) 06/04/2023 (Originally 03/13/2023)   Zoster Vaccines- Shingrix (1 of 2) 08/19/2023 (Originally 08/17/1996)   INFLUENZA VACCINE  10/10/2023 (Originally 02/10/2023)   DTaP/Tdap/Td (1 - Tdap) 05/18/2024 (Originally 08/17/1964)   Pneumonia Vaccine 22+ Years old (1 of 1 - PCV) 05/18/2024 (Originally 08/18/2011)   Hepatitis C Screening  Completed   HPV VACCINES  Aged Out    Physical Exam: Vitals:   06/02/23 1420  BP: 122/78  Pulse: 80  Resp: 18  Temp: (!) 97.5 F (36.4 C)  SpO2: 99%  Weight: 161 lb (73 kg)  Height: 5\' 7"  (1.702 m)   Body mass index is 25.22 kg/m. Physical Exam Constitutional:  Appearance: Normal appearance.  HENT:     Head: Normocephalic and atraumatic.     Mouth/Throat:     Mouth: Mucous membranes are moist.  Eyes:     Conjunctiva/sclera: Conjunctivae normal.     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     Pulses: Normal pulses.     Heart sounds: Normal heart sounds.  Pulmonary:     Effort: Pulmonary effort is normal.     Breath sounds: Normal breath sounds.  Abdominal:     General: Bowel sounds are normal.     Palpations: Abdomen is soft.  Musculoskeletal:        General: No swelling.      Cervical back: Normal range of motion.  Skin:    General: Skin is warm and dry.  Neurological:     General: No focal deficit present.     Mental Status: He is alert and oriented to person, place, and time.     Comments: Right-sided weakness  Psychiatric:        Mood and Affect: Mood normal.        Behavior: Behavior normal.        Thought Content: Thought content normal.        Judgment: Judgment normal.     Labs reviewed: Basic Metabolic Panel: Recent Labs    02/15/23 0006 02/16/23 0418 05/20/23 1008  NA 128* 132* 141  K 3.7 3.7 3.8  CL 94* 95* 106  CO2 23 24 25   GLUCOSE 88 91 87  BUN 10 12 14   CREATININE 0.71 0.68 0.78  CALCIUM 7.8* 8.1* 9.3   Liver Function Tests: Recent Labs    02/12/23 0451 02/14/23 0417 02/15/23 0006 05/20/23 1008  AST 43* 62* 55* 12  ALT 31 39 39 9  ALKPHOS 55 68 61  --   BILITOT 0.6 0.9 0.6 0.4  PROT 6.9 7.6 7.0 7.7  ALBUMIN 2.4* 2.3* 2.0*  --    No results for input(s): "LIPASE", "AMYLASE" in the last 8760 hours. No results for input(s): "AMMONIA" in the last 8760 hours. CBC: Recent Labs    02/09/23 1043 02/10/23 0726 02/15/23 0006 02/16/23 0418 05/20/23 1008  WBC 4.7   < > 3.4* 4.1 4.7  NEUTROABS 3.3  --   --   --  3,079  HGB 11.7*   < > 11.8* 11.3* 11.3*  HCT 36.4*   < > 35.2* 34.2* 35.4*  MCV 89.4   < > 88.4 87.7 91.2  PLT 212   < > 259 291 308   < > = values in this interval not displayed.   Lipid Panel: Recent Labs    05/20/23 1008  CHOL 168  HDL 50  LDLCALC 105*  TRIG 43  CHOLHDL 3.4   Lab Results  Component Value Date   HGBA1C 5.4 05/20/2023    Procedures since last visit: No results found.  Assessment/Plan  1. 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 Hyperlipidemia, unspecified  hyperlipidemia type Lab Results  Component Value Date   CHOL 168 05/20/2023   HDL 50 05/20/2023   LDLCALC 105 (H) 05/20/2023   TRIG 43 05/20/2023   CHOLHDL 3.4 05/20/2023    - will  start on atorvastatin (LIPITOR) 40 MG tablet; Take 1 tablet (40 mg total) by mouth daily.  Dispense: 90 tablet; Refill: 1  3. Essential hypertension -  BP 122/78, stable -   continue Amlodipine  4. Iron deficiency anemia, unspecified iron deficiency anemia type Lab Results  Component Value Date   HGB 11.3 (L) 05/20/2023   - Multiple Vitamins-Minerals (MULTIVITAMIN WITH MINERALS) tablet; Take 1 tablet by mouth daily.     Labs/tests ordered:   None   Next appt:  Visit date not found

## 2023-09-01 ENCOUNTER — Ambulatory Visit: Payer: Medicare Other | Admitting: Adult Health

## 2023-09-08 ENCOUNTER — Encounter: Payer: Self-pay | Admitting: Adult Health

## 2023-09-08 ENCOUNTER — Telehealth: Payer: Medicare Other | Admitting: Adult Health

## 2023-09-08 DIAGNOSIS — I10 Essential (primary) hypertension: Secondary | ICD-10-CM

## 2023-09-08 DIAGNOSIS — H6123 Impacted cerumen, bilateral: Secondary | ICD-10-CM | POA: Diagnosis not present

## 2023-09-08 DIAGNOSIS — H543 Unqualified visual loss, both eyes: Secondary | ICD-10-CM

## 2023-09-08 DIAGNOSIS — E785 Hyperlipidemia, unspecified: Secondary | ICD-10-CM

## 2023-09-08 DIAGNOSIS — G8929 Other chronic pain: Secondary | ICD-10-CM

## 2023-09-08 DIAGNOSIS — M545 Low back pain, unspecified: Secondary | ICD-10-CM

## 2023-09-08 MED ORDER — DEBROX 6.5 % OT SOLN
5.0000 [drp] | Freq: Two times a day (BID) | OTIC | Status: AC
Start: 2023-09-08 — End: 2023-09-12

## 2023-09-08 MED ORDER — LIDOCAINE 4 % EX GEL: 1.0000 "application " | Freq: Two times a day (BID) | CUTANEOUS | 3 refills | Status: DC | PRN

## 2023-09-08 NOTE — Progress Notes (Signed)
 This service is provided via telemedicine  No vital signs collected/recorded due to the encounter was a telemedicine visit.   Location of patient (ex: home, work):  Home   Patient consents to a telephone visit:  Yes, 09/08/23   Location of the provider (ex: office, home):  [facility]  Name of any referring provider:  Medina-Vargas, Margit Banda, NP   Names of all persons participating in the telemedicine service and their role in the encounter: Adryana Mogensen B/CMA, Medina-Vargas, Monina C, NP , and patient  Time spent on call:  11 minutes

## 2023-09-08 NOTE — Progress Notes (Signed)
 Virtual Visit via Video Note  I connected with Tyler Alvarado on 09/08/23 at  2:00 PM EST by a video enabled telemedicine application and verified that I am speaking with the correct person using two identifiers.  Patient Location: Home Provider Location: Office/Clinic  I discussed the limitations, risks, security, and privacy concerns of performing an evaluation and management service by video and the availability of in person appointments. I also discussed with the patient that there may be a patient responsible charge related to this service. The patient expressed understanding and agreed to proceed.   PCP: Gillis Santa, NP  Chief Complaint  Patient presents with   Medical Management of Chronic Issues    3 months follow-up   Immunizations    Covid   Health Maintenance    Medicare Annual Wellness Visit   HPI Discussed the use of AI scribe software for clinical note transcription with the patient, who gave verbal consent to proceed.  History of Present Illness   Tyler Alvarado is a 78 year old male who had a video visit presenting with ear congestion and muscle weakness. He is in the video visit together with Naima, his daughter.  He has been experiencing ear congestion without associated pain or fever, describing his ears as 'stopped up' and suspecting a possible wax buildup. He has not yet attempted any treatment for this issue.  He has a history of muscle weakness, particularly on the right side, which has shown improvement over time. He is now able to perform tasks such as opening jars with his right hand, which he previously could not do. He engages in regular exercises at home, including pushups, jumping jacks, and isometric exercises.  He experiences lower back pain, particularly at night, which he rates as 3 to 5 out of 10 in severity. This pain occasionally leads to muscle spasms in both legs. He recalls a recent incident of significant back pain after  attempting to lift a mattress.  He is blind in both eyes and has been cautious to avoid falls. He has been increasing his mobility around the house and using a wheelchair less frequently.  He is currently taking amlodipine 10 mg daily for hypertension, with recent home blood pressure readings around 126/80 mmHg. He also takes atorvastatin 40 mg daily for hyperlipidemia, with a recent LDL level of 105 mg/dL. He has a supply of these medications for the next month. Additionally, he takes aspirin and multivitamins with minerals and is considering obtaining more multivitamins soon.    ROS: Per HPI  Current Outpatient Medications:    amLODipine (NORVASC) 10 MG tablet, Take 10 mg by mouth daily., Disp: , Rfl:    aspirin EC 81 MG EC tablet, Take 1 tablet (81 mg total) by mouth daily. Swallow whole., Disp: 30 tablet, Rfl: 11   atorvastatin (LIPITOR) 40 MG tablet, Take 1 tablet (40 mg total) by mouth daily., Disp: 90 tablet, Rfl: 1   Multiple Vitamins-Minerals (MULTIVITAMIN WITH MINERALS) tablet, Take 1 tablet by mouth daily., Disp: , Rfl:    acetaminophen (TYLENOL) 500 MG tablet, Take 1,000 mg by mouth 2 (two) times daily as needed for moderate pain. (Patient not taking: Reported on 05/19/2023), Disp: , Rfl:    Lidocaine 4 % GEL, Apply 1 application  topically 2 (two) times daily as needed (back pain). (Patient not taking: Reported on 05/19/2023), Disp: , Rfl:   Observations/Objective: Today's Vitals   Physical Exam  Assessment and Plan:  1. Essential hypertension (Primary) -  Well controlled on Amlodipine 10mg  daily with home readings of 126/80. -Continue Amlodipine 10mg  daily.  2. Hyperlipidemia, unspecified hyperlipidemia type Lab Results  Component Value Date   CHOL 168 05/20/2023   HDL 50 05/20/2023   LDLCALC 105 (H) 05/20/2023   TRIG 43 05/20/2023   CHOLHDL 3.4 05/20/2023    -  LDL slightly elevated at 105 on Atorvastatin 40mg  daily. -Continue Atorvastatin 40mg  daily.  3.  Blindness, bilateral -  fall precautions discussed  4. Bilateral impacted cerumen -  Patient reports both ears feeling "stopped up". No pain or fever reported. -Start Debrox ear drops, 5 drops in both ears twice daily for 4 days. -Schedule office visit for ear lavage on day 5. - carbamide peroxide (DEBROX) 6.5 % OTIC solution; Place 5 drops into both ears 2 (two) times daily for 4 days.  5. Chronic midline low back pain without sciatica -  Patient reports lower back pain at night, rating it 3-5/10. Pain is associated with muscle spasms in both legs. -continue Lidocaine 4% gel daily PRN -Patient to see a chiropractor for potential relief.    General Health Maintenance -Continue multivitamin with minerals. -Prescribe Lidocaine gel for lower back pain, to be picked up at [facility] on [address]. -Schedule follow-up appointment for next Thursday.        Follow Up Instructions: in 1 week    I discussed the assessment and treatment plan with the patient. The patient was provided an opportunity to ask questions, and all were answered. The patient agreed with the plan and demonstrated an understanding of the instructions.   The patient was advised to call back or seek an in-person evaluation if the symptoms worsen or if the condition fails to improve as anticipated.  The above assessment and management plan was discussed with the patient. The patient verbalized understanding of and has agreed to the management plan.   Kenard Gower, NP

## 2023-09-08 NOTE — Progress Notes (Signed)
 I connected with  Tyler Alvarado on 09/08/23 by a video enabled telemedicine application and verified that I am speaking with the correct person using two identifiers.   I discussed the limitations of evaluation and management by telemedicine. The patient expressed understanding and agreed to proceed.

## 2023-09-22 ENCOUNTER — Ambulatory Visit (INDEPENDENT_AMBULATORY_CARE_PROVIDER_SITE_OTHER): Payer: Medicare Other | Admitting: Adult Health

## 2023-09-22 ENCOUNTER — Encounter: Payer: Self-pay | Admitting: Adult Health

## 2023-09-22 VITALS — BP 138/70 | Temp 98.8°F | Resp 20 | Ht 67.0 in | Wt 172.0 lb

## 2023-09-22 DIAGNOSIS — H6123 Impacted cerumen, bilateral: Secondary | ICD-10-CM

## 2023-09-22 DIAGNOSIS — M150 Primary generalized (osteo)arthritis: Secondary | ICD-10-CM

## 2023-09-22 DIAGNOSIS — D509 Iron deficiency anemia, unspecified: Secondary | ICD-10-CM

## 2023-09-22 DIAGNOSIS — I10 Essential (primary) hypertension: Secondary | ICD-10-CM

## 2023-09-22 DIAGNOSIS — H543 Unqualified visual loss, both eyes: Secondary | ICD-10-CM

## 2023-09-22 DIAGNOSIS — Z8673 Personal history of transient ischemic attack (TIA), and cerebral infarction without residual deficits: Secondary | ICD-10-CM

## 2023-09-22 DIAGNOSIS — Z1212 Encounter for screening for malignant neoplasm of rectum: Secondary | ICD-10-CM

## 2023-09-22 DIAGNOSIS — E785 Hyperlipidemia, unspecified: Secondary | ICD-10-CM

## 2023-09-22 DIAGNOSIS — Z1211 Encounter for screening for malignant neoplasm of colon: Secondary | ICD-10-CM

## 2023-09-22 NOTE — Progress Notes (Signed)
 [facility]  Provider:  Kenard Gower DNP  Code Status:  Full Code  Goals of Care:     05/19/2023    1:13 PM  Advanced Directives  Does Patient Have a Medical Advance Directive? No  Would patient like information on creating a medical advance directive? No - Patient declined     Chief Complaint  Patient presents with   Follow-up    Follow up visit.   Discussed the use of AI scribe software for clinical note transcription with the patient, who gave verbal consent to proceed.  HPI: Patient is a 78 y.o. male seen today for an acute visit for ear lavage. He was accompanied today by his daughter, Naima.  He experiences persistent soreness in his joints, particularly in the hips and waist, which sometimes extends to his back. The pain is mild, with a usual intensity of 2 out of 10, occasionally increasing to 4. He applies lidocaine 4% gel to the affected areas, but it provides minimal relief. Daily exercise helps ease the pain, and he avoids taking Tylenol. Imaging showed he has  arthritic changes in his hips and spine but has not seen an orthopedic doctor for his joint issues.  He has a history of hypertension, managed with amlodipine 10 mg daily. He has a history of stroke and takes baby aspirin as a preventive measure. No weakness in his arms, but he experiences occasional spasms in his legs, more frequently on the left side, especially at night. He takes Lipitor 40 mg daily for hyperlipidemia, with his last LDL level recorded at 105 mg/dL in November 1610.  He has not visited an eye doctor in the past two years despite having had multiple eye surgeries, including cataract surgery. He does not believe his vision has changed significantly since then.  He exercises daily by walking around the house and has not experienced any falls. He reports drinking alcohol infrequently, with the last instance being in December of the previous year. He averages three to four hours of sleep per  night, sometimes waking up for about half an hour before going back to sleep.     Past Medical History:  Diagnosis Date   Hypertension    Stroke [facility])     Past Surgical History:  Procedure Laterality Date   EYE SURGERY      Allergies  Allergen Reactions   Pork-Derived Products Other (See Comments)    Pt does not consume pork products    Outpatient Encounter Medications as of 09/22/2023  Medication Sig   acetaminophen (TYLENOL) 500 MG tablet Take 1,000 mg by mouth 2 (two) times daily as needed for moderate pain. (Patient not taking: Reported on 05/19/2023)   amLODipine (NORVASC) 10 MG tablet Take 10 mg by mouth daily.   aspirin EC 81 MG EC tablet Take 1 tablet (81 mg total) by mouth daily. Swallow whole.   atorvastatin (LIPITOR) 40 MG tablet Take 1 tablet (40 mg total) by mouth daily.   Lidocaine 4 % GEL Apply 1 application  topically 2 (two) times daily as needed (back pain).   Multiple Vitamins-Minerals (MULTIVITAMIN WITH MINERALS) tablet Take 1 tablet by mouth daily.   No facility-administered encounter medications on file as of 09/22/2023.    Review of Systems:  Review of Systems  Constitutional:  Negative for activity change, appetite change and fever.  HENT:  Negative for sore throat.   Eyes: Negative.        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  Gastrointestinal:  Negative for abdominal distention, diarrhea and vomiting.  Genitourinary:  Negative for dysuria, frequency and urgency.  Musculoskeletal:  Positive for arthralgias.  Skin:  Negative for color change.  Neurological:  Negative for dizziness and headaches.  Psychiatric/Behavioral:  Negative for behavioral problems and sleep disturbance. The patient is not nervous/anxious.     Health Maintenance  Topic Date Due   Medicare Annual Wellness (AWV)  Never done   Zoster Vaccines- Shingrix (1 of 2) Never done   COVID-19 Vaccine (4 - 2024-25 season) 03/13/2023   INFLUENZA  VACCINE  10/10/2023 (Originally 02/10/2023)   DTaP/Tdap/Td (1 - Tdap) 05/18/2024 (Originally 08/17/1964)   Pneumonia Vaccine 41+ Years old (1 of 1 - PCV) 05/18/2024 (Originally 08/18/2011)   Hepatitis C Screening  Completed   HPV VACCINES  Aged Out    Physical Exam: Vitals:   09/22/23 1143  Height: 5\' 7"  (1.702 m)   Body mass index is 25.22 kg/m. Physical Exam Constitutional:      Appearance: Normal appearance.  HENT:     Head: Normocephalic and atraumatic.     Mouth/Throat:     Mouth: Mucous membranes are moist.  Eyes:     Comments: Blind on both eyes  Cardiovascular:     Rate and Rhythm: Normal rate and regular rhythm.     Pulses: Normal pulses.     Heart sounds: Normal heart sounds.  Pulmonary:     Effort: Pulmonary effort is normal.     Breath sounds: Normal breath sounds.  Abdominal:     General: Bowel sounds are normal.     Palpations: Abdomen is soft.  Musculoskeletal:        General: No swelling. Normal range of motion.     Cervical back: Normal range of motion.  Skin:    General: Skin is warm and dry.  Neurological:     General: No focal deficit present.     Mental Status: He is alert and oriented to person, place, and time.  Psychiatric:        Mood and Affect: Mood normal.        Behavior: Behavior normal.        Thought Content: Thought content normal.        Judgment: Judgment normal.     Labs reviewed: Basic Metabolic Panel: Recent Labs    02/15/23 0006 02/16/23 0418 05/20/23 1008  NA 128* 132* 141  K 3.7 3.7 3.8  CL 94* 95* 106  CO2 23 24 25   GLUCOSE 88 91 87  BUN 10 12 14   CREATININE 0.71 0.68 0.78  CALCIUM 7.8* 8.1* 9.3   Liver Function Tests: Recent Labs    02/12/23 0451 02/14/23 0417 02/15/23 0006 05/20/23 1008  AST 43* 62* 55* 12  ALT 31 39 39 9  ALKPHOS 55 68 61  --   BILITOT 0.6 0.9 0.6 0.4  PROT 6.9 7.6 7.0 7.7  ALBUMIN 2.4* 2.3* 2.0*  --    No results for input(s): "LIPASE", "AMYLASE" in the last 8760 hours. No results  for input(s): "AMMONIA" in the last 8760 hours. CBC: Recent Labs    02/09/23 1043 02/10/23 0726 02/15/23 0006 02/16/23 0418 05/20/23 1008  WBC 4.7   < > 3.4* 4.1 4.7  NEUTROABS 3.3  --   --   --  3,079  HGB 11.7*   < > 11.8* 11.3* 11.3*  HCT 36.4*   < > 35.2* 34.2* 35.4*  MCV 89.4   < >  88.4 87.7 91.2  PLT 212   < > 259 291 308   < > = values in this interval not displayed.   Lipid Panel: Recent Labs    05/20/23 1008  CHOL 168  HDL 50  LDLCALC 105*  TRIG 43  CHOLHDL 3.4   Lab Results  Component Value Date   HGBA1C 5.4 05/20/2023    Procedures since last visit: No results found.  Assessment/Plan  1. Bilateral impacted cerumen (Primary) -  ear lavag done,obtained moderate cerumen - patient was able to tolerate procedure, verbalized able to hear "better"  2. Primary osteoarthritis involving multiple joints -  Chronic joint pain in hips and lower back with mild degenerative changes. Pain manageable without orthopedic intervention. - Continue lidocaine 4% gel and Acetaminophen PRN for pain relief. - Consider referral to orthopedics if pain becomes intolerable. - Encourage continued regular exercise.  3. Essential hypertension -  Hypertension well-controlled with amlodipine. - Continue amlodipine 10 mg daily.  4. Iron deficiency anemia, unspecified iron deficiency anemia type Lab Results  Component Value Date   HGB 11.3 (L) 05/20/2023   -  Mild anemia with hemoglobin 11.3 g/dL, no current intervention needed.  5. Hyperlipidemia LDL goal <100 Lab Results  Component Value Date   CHOL 168 05/20/2023   HDL 50 05/20/2023   LDLCALC 105 (H) 05/20/2023   TRIG 43 05/20/2023   CHOLHDL 3.4 05/20/2023    -  Hyperlipidemia managed with Lipitor. Last lipid panel showed LDL 105 mg/dL, triglycerides 43 mg/dL. - Continue Lipitor 40 mg daily.  6. Blindness, bilateral -  History of multiple eye surgeries, no ophthalmologic follow-up in two years. - Refer to  ophthalmologist for examination and follow-up. - Ambulatory referral to Ophthalmology  7. History of CVA (cerebrovascular accident) -  on baby aspirin for secondary prevention. Occasional nocturnal leg spasms. - Continue baby aspirin for stroke prevention.  8. Screening for colorectal cancer -  denies bloody stools - Ambulatory referral to Gastroenterology    General Health Maintenance Occasional alcohol consumption, non-smoker, regular exercise, fragmented sleep pattern. - Advise on maintaining a healthy sleep routine.  -  discussed AWV appointment   Labs/tests ordered:  None     Tawna Alwin Medina-Vargas, NP

## 2023-09-28 ENCOUNTER — Telehealth: Payer: Self-pay

## 2023-09-28 NOTE — Telephone Encounter (Signed)
 Copied from CRM [id number]. Topic: Clinical - Prescription Issue >> Sep 28, 2023  9:21 AM Hector Shade B wrote: Reason for CRM: [facility] called to have the patient's medication refilled; however, there was on one medication which was on that list being requested. It also shows the patient's preferred pharmacy is [facility] [phone] [place], [place] - [address]  Phone: [phone] Fax: [phone]   Please call Silvestre Mesi at [facility] to clarify prescription refills [phone]  Outgoing call placed to [facility] and they stated that patient would like all 4 of his prescriptions filled through them  1.) Atorvastatin 2.) Odasteron (not on medication list)  3.) Amoxicilllin (not on medication list)  4.) D2 1.25 mg (not on medication list)  I informed the [facility] representative that I will follow-up with patient prior to sending them any refills. Outgoing call placed to patient, no answer, and left detailed voicemail informing him he needs to schedule a video or in person visit to discuss refill request.

## 2023-09-30 ENCOUNTER — Telehealth: Payer: Self-pay

## 2023-09-30 ENCOUNTER — Other Ambulatory Visit: Payer: Self-pay | Admitting: Adult Health

## 2023-09-30 DIAGNOSIS — E785 Hyperlipidemia, unspecified: Secondary | ICD-10-CM

## 2023-09-30 MED ORDER — ATORVASTATIN CALCIUM 40 MG PO TABS
40.0000 mg | ORAL_TABLET | Freq: Every day | ORAL | 1 refills | Status: DC
Start: 2023-09-30 — End: 2024-04-06

## 2023-09-30 MED ORDER — ATORVASTATIN CALCIUM 40 MG PO TABS
40.0000 mg | ORAL_TABLET | Freq: Every day | ORAL | 1 refills | Status: DC
Start: 2023-09-30 — End: 2023-09-30

## 2023-09-30 NOTE — Telephone Encounter (Signed)
 Copied from CRM [id number]. Topic: Clinical - Medication Refill >> Sep 30, 2023 10:33 AM Claudine Mouton wrote: Most Recent Primary Care Visit:  Provider: Gillis Santa  Department: [facility]  Visit Type: OFFICE VISIT  Date: 09/22/2023  Medication: atorvastatin (LIPITOR) 40 MG tablet [[id number]]  Has the patient contacted their pharmacy? Yes (Agent: If no, request that the patient contact the pharmacy for the refill. If patient does not wish to contact the pharmacy document the reason why and proceed with request.) (Agent: If yes, when and what did the pharmacy advise?) Pharmacy called.   Is this the correct pharmacy for this prescription? Yes If no, delete pharmacy and type the correct one.  This is the patient's preferred pharmacy:   [facility] [address] [place], [place] [address]  Phone: [phone] Fax: [phone] Pharmacist: Julian Hy  Has the prescription been filled recently? No  Is the patient out of the medication? Yes  Has the patient been seen for an appointment in the last year OR does the patient have an upcoming appointment? Yes  Can we respond through MyChart? Yes  Agent: Please be advised that Rx refills may take up to 3 business days. We ask that you follow-up with your pharmacy.

## 2023-09-30 NOTE — Telephone Encounter (Signed)
Message routed to PCP Medina-Vargas, Monina C, NP  

## 2023-10-04 NOTE — Telephone Encounter (Signed)
 Prescription refilled by PCP Medina-Vargas, Margit Banda, NP

## 2023-12-12 ENCOUNTER — Encounter: Payer: Self-pay | Admitting: Adult Health

## 2024-01-11 ENCOUNTER — Other Ambulatory Visit: Payer: Self-pay | Admitting: Adult Health

## 2024-01-11 NOTE — Telephone Encounter (Signed)
 Copied from CRM [id number]. Topic: Clinical - Medication Refill >> Jan 11, 2024  1:14 PM Cherylann S wrote: Medication: amLODipine  (NORVASC ) 10 MG tablet  Has the patient contacted their pharmacy? Yes (Agent: If no, request that the patient contact the pharmacy for the refill. If patient does not wish to contact the pharmacy document the reason why and proceed with request.) (Agent: If yes, when and what did the pharmacy advise?)  This is the patient's preferred pharmacy:  [facility] [place] - [address] [place] [address] Phone: [phone] Fax: [phone]  Is this the correct pharmacy for this prescription? Yes If no, delete pharmacy and type the correct one.   Has the prescription been filled recently? No  Is the patient out of the medication? No  Has the patient been seen for an appointment in the last year OR does the patient have an upcoming appointment? Yes  Can we respond through MyChart? Yes  Agent: Please be advised that Rx refills may take up to 3 business days. We ask that you follow-up with your pharmacy.

## 2024-01-16 MED ORDER — ACETAMINOPHEN 500 MG PO TABS: 1000.0000 mg | ORAL_TABLET | Freq: Two times a day (BID) | ORAL | 11 refills | Status: DC | PRN

## 2024-01-16 MED ORDER — AMLODIPINE BESYLATE 10 MG PO TABS: 10.0000 mg | ORAL_TABLET | Freq: Every day | ORAL | 1 refills | Status: DC

## 2024-01-16 NOTE — Telephone Encounter (Signed)
 Medication has never been refilled by us .

## 2024-03-30 ENCOUNTER — Ambulatory Visit: Admitting: Adult Health

## 2024-04-02 ENCOUNTER — Ambulatory Visit: Admitting: Adult Health

## 2024-04-06 ENCOUNTER — Ambulatory Visit (INDEPENDENT_AMBULATORY_CARE_PROVIDER_SITE_OTHER): Admitting: Adult Health

## 2024-04-06 ENCOUNTER — Other Ambulatory Visit: Payer: Self-pay | Admitting: Adult Health

## 2024-04-06 ENCOUNTER — Encounter: Payer: Self-pay | Admitting: Adult Health

## 2024-04-06 VITALS — BP 136/84 | HR 80 | Temp 99.4°F | Ht 67.0 in | Wt 181.6 lb

## 2024-04-06 DIAGNOSIS — I10 Essential (primary) hypertension: Secondary | ICD-10-CM

## 2024-04-06 DIAGNOSIS — H543 Unqualified visual loss, both eyes: Secondary | ICD-10-CM

## 2024-04-06 DIAGNOSIS — M150 Primary generalized (osteo)arthritis: Secondary | ICD-10-CM | POA: Diagnosis not present

## 2024-04-06 DIAGNOSIS — E785 Hyperlipidemia, unspecified: Secondary | ICD-10-CM

## 2024-04-06 DIAGNOSIS — Z8673 Personal history of transient ischemic attack (TIA), and cerebral infarction without residual deficits: Secondary | ICD-10-CM

## 2024-04-06 DIAGNOSIS — R251 Tremor, unspecified: Secondary | ICD-10-CM

## 2024-04-06 DIAGNOSIS — D509 Iron deficiency anemia, unspecified: Secondary | ICD-10-CM

## 2024-04-06 MED ORDER — BIOTIN 5 MG PO TABS: 5.0000 mg | ORAL_TABLET | Freq: Every day | ORAL | Status: DC

## 2024-04-06 NOTE — Progress Notes (Signed)
 [facility]  Provider:  Jereld Serum DNP  Code Status:  Full Code  Goals of Care:     09/22/2023    2:49 PM  Advanced Directives  Does Patient Have a Medical Advance Directive? No  Would patient like information on creating a medical advance directive? No - Patient declined     Chief Complaint  Patient presents with   Follow-up    6 month follow up. Patient has concern calcium      Discussed the use of AI scribe software for clinical note transcription with the patient, who gave verbal consent to proceed.  HPI: Patient is a 78 y.o. male seen today for a 33-month follow up of chronic medical issues. He was accompanied by his daughter, Naima.  He has experienced improvement in his condition, no longer requiring a wheelchair and working on increasing his strength through exercises. He no longer experiences severe pain as he did initially. He reports weakness in both legs, with the right leg being weaker. He also reports weakness in his shoulder joints, particularly on the left side. He has noticed improvement in his grip strength, now able to open jars and bottles, which he could not do previously.  He has a history of hypertension and is currently taking amlodipine  10 mg daily, along with atorvastatin  40 mg daily and baby aspirin  81 mg daily. His blood pressure was recorded as 136/84 today. He does not have a blood pressure gauge at home. His last blood work in November showed an LDL of 105 mg/dL and triglycerides at 43 mg/dL. He is not diabetic, with an A1c of 5.4%.  He lives alone in an apartment and has been doing more exercises to get stronger. He has stopped drinking alcohol. He enjoys eating sweets but tries not to overindulge.  He has some vision impairment, with the right eye able to see shadows and the left eye significantly impaired due to a past accident. He uses his phone to research and has been listening to NPR. He does not take the flu, shingles, or pneumonia  vaccines.       Past Medical History:  Diagnosis Date   Hypertension    Stroke [facility])     Past Surgical History:  Procedure Laterality Date   EYE SURGERY      Allergies  Allergen Reactions   Pork-Derived Products Other (See Comments)    Pt does not consume pork products    Outpatient Encounter Medications as of 04/06/2024  Medication Sig   acetaminophen  (TYLENOL ) 500 MG tablet Take 2 tablets (1,000 mg total) by mouth 2 (two) times daily as needed for moderate pain (pain score 4-6).   amLODipine  (NORVASC ) 10 MG tablet Take 1 tablet (10 mg total) by mouth daily.   aspirin  EC 81 MG EC tablet Take 1 tablet (81 mg total) by mouth daily. Swallow whole.   atorvastatin  (LIPITOR ) 40 MG tablet TAKE ONE TABLET (40 MG TOTAL) BY MOUTH DAILY AT 5PM   Biotin  5 MG TABS Take 1 tablet (5 mg total) by mouth daily.   Lidocaine  4 % GEL Apply 1 application  topically 2 (two) times daily as needed (back pain).   Multiple Vitamins-Minerals (MULTIVITAMIN WITH MINERALS) tablet Take 1 tablet by mouth daily.   [DISCONTINUED] atorvastatin  (LIPITOR ) 40 MG tablet Take 1 tablet (40 mg total) by mouth daily.   No facility-administered encounter medications on file as of 04/06/2024.    Review of Systems:  Review of Systems  Constitutional:  Negative for activity  change, appetite change and fever.  HENT:  Negative for sore throat.   Eyes: Negative.   Cardiovascular:  Negative for chest pain and leg swelling.  Gastrointestinal:  Negative for abdominal distention, diarrhea and vomiting.  Genitourinary:  Negative for dysuria, frequency and urgency.  Skin:  Negative for color change.  Neurological:  Negative for dizziness and headaches.  Psychiatric/Behavioral:  Negative for behavioral problems and sleep disturbance. The patient is not nervous/anxious.     Health Maintenance  Topic Date Due   Medicare Annual Wellness (AWV)  Never done   COVID-19 Vaccine (4 - 2025-26 season) 04/22/2024 (Originally 03/12/2024)    DTaP/Tdap/Td (1 - Tdap) 05/18/2024 (Originally 08/17/1964)   Pneumococcal Vaccine: 50+ Years (1 of 1 - PCV) 05/18/2024 (Originally 08/17/1996)   Influenza Vaccine  10/09/2024 (Originally 02/10/2024)   Hepatitis C Screening  Completed   HPV VACCINES  Aged Out   Meningococcal B Vaccine  Aged Out   Zoster Vaccines- Shingrix  Discontinued    Physical Exam: Vitals:   04/06/24 0824  BP: 136/84  Pulse: 80  Temp: 99.4 F (37.4 C)  SpO2: 96%  Weight: 181 lb 9.6 oz (82.4 kg)  Height: 5' 7 (1.702 m)   Body mass index is 28.44 kg/m. Physical Exam Constitutional:      Appearance: Normal appearance.  HENT:     Head: Normocephalic and atraumatic.     Mouth/Throat:     Mouth: Mucous membranes are moist.  Eyes:     Conjunctiva/sclera: Conjunctivae normal.     Comments: Left eye is blind  Cardiovascular:     Rate and Rhythm: Normal rate and regular rhythm.     Pulses: Normal pulses.     Heart sounds: Normal heart sounds.  Pulmonary:     Effort: Pulmonary effort is normal.     Breath sounds: Normal breath sounds.  Abdominal:     General: Bowel sounds are normal.     Palpations: Abdomen is soft.  Musculoskeletal:        General: No swelling. Normal range of motion.     Cervical back: Normal range of motion.  Skin:    General: Skin is warm and dry.  Neurological:     General: No focal deficit present.     Mental Status: He is alert and oriented to person, place, and time.  Psychiatric:        Mood and Affect: Mood normal.        Behavior: Behavior normal.        Thought Content: Thought content normal.        Judgment: Judgment normal.     Labs reviewed: Basic Metabolic Panel: Recent Labs    05/20/23 1008  NA 141  K 3.8  CL 106  CO2 25  GLUCOSE 87  BUN 14  CREATININE 0.78  CALCIUM  9.3   Liver Function Tests: Recent Labs    05/20/23 1008  AST 12  ALT 9  BILITOT 0.4  PROT 7.7   No results for input(s): LIPASE, AMYLASE in the last 8760 hours. No results for  input(s): AMMONIA in the last 8760 hours. CBC: Recent Labs    05/20/23 1008  WBC 4.7  NEUTROABS 3,079  HGB 11.3*  HCT 35.4*  MCV 91.2  PLT 308   Lipid Panel: Recent Labs    05/20/23 1008  CHOL 168  HDL 50  LDLCALC 105*  TRIG 43  CHOLHDL 3.4   Lab Results  Component Value Date   HGBA1C 5.4 05/20/2023  Procedures since last visit: No results found.  Assessment/Plan  1. Essential hypertension (Primary) -  Blood pressure 136/84 mmHg, above target <130/80 mmHg due to stroke. On amlodipine  10 mg daily. Kidney function good, GFR 92. Mild left lower extremity edema, 1+ pitting. - Continue amlodipine  10 mg daily. - Consider diuretic if blood pressure remains above target. - Obtain new blood pressure gauge for home monitoring.  2. Hyperlipidemia, unspecified hyperlipidemia type -  On atorvastatin  40 mg daily. LDL 105 mg/dL, above target <29 mg/dL for secondary prevention in stroke patients. Total cholesterol 168 mg/dL, triglycerides 43 mg/dL within normal limits. - Continue atorvastatin  40 mg daily. - Repeat lipid panel in November.  3. Primary osteoarthritis involving multiple joints -  Improvement in joint pain, no longer using lidocaine  gel. Exercises regularly, pain manageable. - Continue regular exercise to maintain joint function.  4. Tremor -  Intermittent right-sided tremor post-stroke, not affecting daily activities. Potential treatment with gabapentin if worsens. - Monitor tremor for increase in severity or impact on daily activities. - Consider gabapentin if tremor worsens.  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 - Continue researching low vision aids and technologies.     Labs/tests ordered:   will do annual labs next visit   Return in about 2 months (around 06/06/2024).  Tyler Krogstad Medina-Vargas, NP

## 2024-04-18 ENCOUNTER — Other Ambulatory Visit: Payer: Self-pay | Admitting: Adult Health

## 2024-04-18 DIAGNOSIS — E785 Hyperlipidemia, unspecified: Secondary | ICD-10-CM

## 2024-04-18 NOTE — Telephone Encounter (Unsigned)
 Copied from CRM #[id number]. Topic: Clinical - Medication Refill >> Apr 18, 2024  4:57 PM Debby BROCKS wrote: Medication: atorvastatin  (LIPITOR ) 40 MG tablet  Has the patient contacted their pharmacy? Yes (Agent: If no, request that the patient contact the pharmacy for the refill. If patient does not wish to contact the pharmacy document the reason why and proceed with request.) (Agent: If yes, when and what did the pharmacy advise?) Pharmacy is the one calling it in  This is the patient's preferred pharmacy:  [facility] - [place] [address] [place] [address] Phone: [phone] Fax: [phone]  Is this the correct pharmacy for this prescription? Yes If no, delete pharmacy and type the correct one.   Has the prescription been filled recently? No  Is the patient out of the medication? No  Has the patient been seen for an appointment in the last year OR does the patient have an upcoming appointment? Yes  Can we respond through MyChart? Yes  Agent: Please be advised that Rx refills may take up to 3 business days. We ask that you follow-up with your pharmacy.

## 2024-04-19 MED ORDER — ATORVASTATIN CALCIUM 40 MG PO TABS
40.0000 mg | ORAL_TABLET | Freq: Every day | ORAL | 0 refills | Status: DC
Start: 2024-04-19 — End: 2024-06-04

## 2024-06-04 ENCOUNTER — Encounter: Payer: Self-pay | Admitting: Adult Health

## 2024-06-04 ENCOUNTER — Ambulatory Visit: Payer: Self-pay | Admitting: Adult Health

## 2024-06-04 VITALS — BP 136/74 | HR 74 | Temp 97.9°F | Ht 67.0 in | Wt 182.0 lb

## 2024-06-04 DIAGNOSIS — H6123 Impacted cerumen, bilateral: Secondary | ICD-10-CM | POA: Diagnosis not present

## 2024-06-04 DIAGNOSIS — E785 Hyperlipidemia, unspecified: Secondary | ICD-10-CM

## 2024-06-04 DIAGNOSIS — D509 Iron deficiency anemia, unspecified: Secondary | ICD-10-CM | POA: Diagnosis not present

## 2024-06-04 DIAGNOSIS — I10 Essential (primary) hypertension: Secondary | ICD-10-CM

## 2024-06-04 DIAGNOSIS — Z125 Encounter for screening for malignant neoplasm of prostate: Secondary | ICD-10-CM

## 2024-06-04 MED ORDER — ATORVASTATIN CALCIUM 40 MG PO TABS
40.0000 mg | ORAL_TABLET | Freq: Every day | ORAL | 1 refills | Status: AC
Start: 2024-06-04 — End: ?

## 2024-06-04 MED ORDER — AMLODIPINE BESYLATE 10 MG PO TABS: 10.0000 mg | ORAL_TABLET | Freq: Every day | ORAL | 1 refills | Status: AC

## 2024-06-04 MED ORDER — DEBROX 6.5 % OT SOLN
5.0000 [drp] | Freq: Two times a day (BID) | OTIC | Status: AC
Start: 2024-06-04 — End: 2024-06-08

## 2024-06-04 NOTE — Progress Notes (Signed)
 [facility]  Provider:  Jereld Serum DNP  Code Status:  Full Code  Goals of Care:     09/22/2023    2:49 PM  Advanced Directives  Does Patient Have a Medical Advance Directive? No  Would patient like information on creating a medical advance directive? No - Patient declined     Chief Complaint  Patient presents with   Medical Management of Chronic Issues    2 month follow up   Rash    Rash on right leg. He would like so cream for it.    Discussed the use of AI scribe software for clinical note transcription with the patient, who gave verbal consent to proceed.  HPI: Patient is a 78 y.o. male seen today for annual labs. He is accompanied by his daughter, Naima.  He reports a significant buildup of earwax, particularly in the right ear, which he cleans daily using a specialized kit. The wax in the right ear is moist and yellow, while the left ear's wax is dry and white. He uses ear drops to manage the wax buildup. He reports no ear pain.  He is currently taking amlodipine  10 mg daily for hypertension, atorvastatin  40 mg daily for hyperlipidemia, and aspirin  81 mg daily. He also takes a multivitamin with minerals. He has stopped taking Tylenol  as he no longer experiences the joint pain that required it.  He lives independently in an affordable housing apartment and uses a cane for mobility. He exercises regularly, focusing on stretching and strengthening exercises, although he notes occasional weakness in his left arm. He does not smoke and avoids areas where others are smoking. His weight is stable, with a slight increase from 181 pounds in September to 182 pounds currently. He monitors his blood pressure at home but needs a new gauge. No falls have occurred, and he is careful due to living alone.     Past Medical History:  Diagnosis Date   Hypertension    Stroke [facility])     Past Surgical History:  Procedure Laterality Date   EYE SURGERY      Allergies  Allergen  Reactions   Porcine (Pork) Protein-Containing Drug Products Other (See Comments)    Pt does not consume pork products    Outpatient Encounter Medications as of 06/04/2024  Medication Sig   amLODipine  (NORVASC ) 10 MG tablet Take 1 tablet (10 mg total) by mouth daily.   aspirin  EC 81 MG EC tablet Take 1 tablet (81 mg total) by mouth daily. Swallow whole.   atorvastatin  (LIPITOR ) 40 MG tablet Take 1 tablet (40 mg total) by mouth daily.   Multiple Vitamins-Minerals (MULTIVITAMIN WITH MINERALS) tablet Take 1 tablet by mouth daily.   acetaminophen  (TYLENOL ) 500 MG tablet Take 2 tablets (1,000 mg total) by mouth 2 (two) times daily as needed for moderate pain (pain score 4-6). (Patient not taking: Reported on 06/04/2024)   Biotin  5 MG TABS Take 1 tablet (5 mg total) by mouth daily. (Patient not taking: Reported on 06/04/2024)   Lidocaine  4 % GEL Apply 1 application  topically 2 (two) times daily as needed (back pain). (Patient not taking: Reported on 06/04/2024)   No facility-administered encounter medications on file as of 06/04/2024.    Review of Systems:  Review of Systems  Constitutional:  Negative for activity change, appetite change and fever.  HENT:  Negative for sore throat.   Eyes: Negative.        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  Gastrointestinal:  Negative for abdominal distention, diarrhea and vomiting.  Genitourinary:  Negative for dysuria, frequency and urgency.  Skin:  Negative for color change.  Neurological:  Negative for dizziness and headaches.  Psychiatric/Behavioral:  Negative for behavioral problems and sleep disturbance. The patient is not nervous/anxious.     Health Maintenance  Topic Date Due   Medicare Annual Wellness (AWV)  Never done   DTaP/Tdap/Td (1 - Tdap) Never done   Pneumococcal Vaccine: 50+ Years (1 of 1 - PCV) Never done   COVID-19 Vaccine (4 - 2025-26 season) 03/12/2024   Influenza Vaccine  10/09/2024  (Originally 02/10/2024)   Hepatitis C Screening  Completed   Meningococcal B Vaccine  Aged Out   Zoster Vaccines- Shingrix  Discontinued    Physical Exam: Vitals:   06/04/24 0920  BP: 136/74  Pulse: 74  Temp: 97.9 F (36.6 C)  TempSrc: Temporal  SpO2: 98%  Weight: 182 lb (82.6 kg)  Height: 5' 7 (1.702 m)   Body mass index is 28.51 kg/m. Physical Exam Constitutional:      General: He is not in acute distress.    Appearance: Normal appearance.  HENT:     Head: Normocephalic and atraumatic.     Right Ear: There is impacted cerumen.     Left Ear: There is impacted cerumen.     Mouth/Throat:     Mouth: Mucous membranes are moist.  Eyes:     Conjunctiva/sclera: Conjunctivae normal.     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     Pulses: Normal pulses.     Heart sounds: Normal heart sounds.  Pulmonary:     Effort: Pulmonary effort is normal.     Breath sounds: Normal breath sounds.  Abdominal:     General: Bowel sounds are normal.     Palpations: Abdomen is soft.  Musculoskeletal:        General: No swelling. Normal range of motion.     Cervical back: Normal range of motion.  Skin:    General: Skin is warm and dry.  Neurological:     General: No focal deficit present.     Mental Status: He is alert and oriented to person, place, and time.  Psychiatric:        Mood and Affect: Mood normal.        Behavior: Behavior normal.        Thought Content: Thought content normal.        Judgment: Judgment normal.     Labs reviewed: Basic Metabolic Panel: No results for input(s): NA, K, CL, CO2, GLUCOSE, BUN, CREATININE, CALCIUM , MG, PHOS, TSH in the last 8760 hours. Liver Function Tests: No results for input(s): AST, ALT, ALKPHOS, BILITOT, PROT, ALBUMIN in the last 8760 hours. No results for input(s): LIPASE, AMYLASE in the last 8760 hours. No results for input(s): AMMONIA  in the last 8760 hours. CBC: No results for input(s): WBC, NEUTROABS, HGB, HCT, MCV, PLT in the last 8760 hours. Lipid Panel: No results for input(s): CHOL, HDL, LDLCALC, TRIG, CHOLHDL, LDLDIRECT in the last 8760 hours. Lab Results  Component Value Date   HGBA1C 5.4 05/20/2023    Procedures since last visit: No results found.  Assessment/Plan  1. Bilateral impacted cerumen (Primary) -  more in right ear. No pain or infection R > L - Use Debrox ear drops, 5 drops each ear twice daily  for 4 days. - Schedule ear irrigation after 4 days of Debrox use.  - carbamide peroxide (DEBROX) 6.5 % OTIC solution; Place 5 drops into both ears 2 (two) times daily for 4 days.  2. Essential hypertension -  Blood pressure controlled at 136/74 mmHg with current medication. - Continue amlodipine  10 mg daily. - Comprehensive metabolic panel - amLODipine  (NORVASC ) 10 MG tablet; Take 1 tablet (10 mg total) by mouth daily.  Dispense: 90 tablet; Refill: 1  3. Hyperlipidemia, unspecified hyperlipidemia type -  Managed with atorvastatin  40 mg daily. - Continue atorvastatin  40 mg daily. - Lipid Panel - atorvastatin  (LIPITOR ) 40 MG tablet; Take 1 tablet (40 mg total) by mouth daily.  Dispense: 90 tablet; Refill: 1  4. Iron deficiency anemia, unspecified iron deficiency anemia type -  hgb 11.3, 05/20/23 - CBC with Differential/Platelets  5. Screening for prostate cancer -  denies urinary issues - PSA    Labs/tests ordered:  PSA, lipid panel CBC, CMP   Return in about 6 months (around 12/02/2024).  Kealani Leckey Medina-Vargas, NP

## 2024-06-05 ENCOUNTER — Ambulatory Visit: Payer: Self-pay | Admitting: Adult Health

## 2024-06-05 LAB — COMPREHENSIVE METABOLIC PANEL WITH GFR
AG Ratio: 1.1 (calc) (ref 1.0–2.5)
ALT: 18 U/L (ref 9–46)
AST: 16 U/L (ref 10–35)
Albumin: 4.1 g/dL (ref 3.6–5.1)
Alkaline phosphatase (APISO): 81 U/L (ref 35–144)
BUN: 15 mg/dL (ref 7–25)
CO2: 28 mmol/L (ref 20–32)
Calcium: 9.4 mg/dL (ref 8.6–10.3)
Chloride: 104 mmol/L (ref 98–110)
Creat: 0.85 mg/dL (ref 0.70–1.28)
Globulin: 3.9 g/dL — ABNORMAL HIGH (ref 1.9–3.7)
Glucose, Bld: 88 mg/dL (ref 65–99)
Potassium: 4.2 mmol/L (ref 3.5–5.3)
Sodium: 140 mmol/L (ref 135–146)
Total Bilirubin: 0.6 mg/dL (ref 0.2–1.2)
Total Protein: 8 g/dL (ref 6.1–8.1)
eGFR: 89 mL/min/1.73m2 (ref >=60)

## 2024-06-05 LAB — CBC WITH DIFFERENTIAL/PLATELET
Absolute Lymphocytes: 1604 {cells}/uL (ref 850–3900)
Absolute Monocytes: 500 {cells}/uL (ref 200–950)
Basophils Absolute: 31 {cells}/uL (ref 0–200)
Basophils Relative: 0.5 %
Eosinophils Absolute: 110 {cells}/uL (ref 15–500)
Eosinophils Relative: 1.8 %
HCT: 40 % (ref 39.4–51.1)
Hemoglobin: 13.2 g/dL (ref 13.2–17.1)
MCH: 31.2 pg (ref 27.0–33.0)
MCHC: 33 g/dL (ref 31.6–35.4)
MCV: 94.6 fL (ref 81.4–101.7)
MPV: 10.5 fL (ref 7.5–12.5)
Monocytes Relative: 8.2 %
Neutro Abs: 3855 {cells}/uL (ref 1500–7800)
Neutrophils Relative %: 63.2 %
Platelets: 206 Thousand/uL (ref 140–400)
RBC: 4.23 Million/uL (ref 4.20–5.80)
RDW: 14.3 % (ref 11.0–15.0)
Total Lymphocyte: 26.3 %
WBC: 6.1 Thousand/uL (ref 3.8–10.8)

## 2024-06-05 LAB — PSA: PSA: 0.85 ng/mL (ref <=4.00)

## 2024-06-05 LAB — LIPID PANEL
Cholesterol: 113 mg/dL (ref <200)
HDL: 54 mg/dL (ref >=40)
LDL Cholesterol (Calc): 46 mg/dL
Non-HDL Cholesterol (Calc): 59 mg/dL (ref <130)
Total CHOL/HDL Ratio: 2.1 (calc) (ref <5.0)
Triglycerides: 44 mg/dL (ref <150)

## 2024-06-05 NOTE — Progress Notes (Signed)
-   electrolytes, lipid panel, PSA, normal -  no anemia, very good!-  continue current orders

## 2024-06-15 ENCOUNTER — Encounter: Payer: Self-pay | Admitting: Adult Health

## 2024-06-15 ENCOUNTER — Ambulatory Visit: Admitting: Adult Health

## 2024-06-15 VITALS — BP 128/74 | HR 81 | Temp 98.9°F | Ht 67.0 in | Wt 185.0 lb

## 2024-06-15 DIAGNOSIS — H6123 Impacted cerumen, bilateral: Secondary | ICD-10-CM

## 2024-06-15 DIAGNOSIS — I10 Essential (primary) hypertension: Secondary | ICD-10-CM

## 2024-06-15 NOTE — Progress Notes (Signed)
 [facility] clinic  Provider:  Jereld Serum DNP  Code Status:  Full Code  Goals of Care:     09/22/2023    2:49 PM  Advanced Directives  Does Patient Have a Medical Advance Directive? No  Would patient like information on creating a medical advance directive? No - Patient declined     Chief Complaint  Patient presents with   Cerumen Impaction   Discussed the use of AI scribe software for clinical note transcription with the patient, who gave verbal consent to proceed.   HPI: Patient is a 78 y.o. male seen today for an acute visit for ear lavage. He is accompanied by his daughter, Naima.  He is experiencing earwax buildup, leading to a sensation of blocked ears and drainage from both ears. He has been using metal tools to clean his ears, which may have exacerbated the issue by pushing wax further inside.  He is concerned about tremors in his right hand, which have improved over time. He is now able to grip, turn, and perform tasks he previously could not do. He denies dropping utensils or having difficulty with activities of daily living, such as cooking or using a table knife.  He has a history of hypertension, managed with amlodipine  10 mg daily.    Past Medical History:  Diagnosis Date   Hypertension    Stroke [facility])     Past Surgical History:  Procedure Laterality Date   EYE SURGERY      Allergies  Allergen Reactions   Porcine (Pork) Protein-Containing Drug Products Other (See Comments)    Pt does not consume pork products    Outpatient Encounter Medications as of 06/15/2024  Medication Sig   amLODipine  (NORVASC ) 10 MG tablet Take 1 tablet (10 mg total) by mouth daily.   aspirin  EC 81 MG EC tablet Take 1 tablet (81 mg total) by mouth daily. Swallow whole.   atorvastatin  (LIPITOR ) 40 MG tablet Take 1 tablet (40 mg total) by mouth daily.   Multiple Vitamins-Minerals (MULTIVITAMIN WITH MINERALS) tablet Take 1 tablet by mouth daily.   No facility-administered  encounter medications on file as of 06/15/2024.    Review of Systems:  Review of Systems  Constitutional:  Negative for activity change, appetite change and fever.  HENT:  Negative for sore throat.   Eyes:  Positive for visual disturbance.       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  Gastrointestinal:  Negative for abdominal distention, diarrhea and vomiting.  Genitourinary:  Negative for dysuria, frequency and urgency.  Skin:  Negative for color change.  Neurological:  Negative for dizziness and headaches.  Psychiatric/Behavioral:  Negative for behavioral problems and sleep disturbance. The patient is not nervous/anxious.     Health Maintenance  Topic Date Due   Medicare Annual Wellness (AWV)  Never done   DTaP/Tdap/Td (1 - Tdap) Never done   Pneumococcal Vaccine: 50+ Years (1 of 1 - PCV) Never done   COVID-19 Vaccine (4 - 2025-26 season) 03/12/2024   Influenza Vaccine  10/09/2024 (Originally 02/10/2024)   Hepatitis C Screening  Completed   Meningococcal B Vaccine  Aged Out   Zoster Vaccines- Shingrix  Discontinued    Physical Exam: Vitals:   06/15/24 1351  BP: 128/74  Pulse: 81  Temp: 98.9 F (37.2 C)  TempSrc: Temporal  SpO2: 96%  Weight: 185 lb (83.9 kg)  Height: 5' 7 (1.702 m)   Body mass index is 28.98 kg/m.  Physical Exam Constitutional:      Appearance: Normal appearance.  HENT:     Head: Normocephalic and atraumatic.     Mouth/Throat:     Mouth: Mucous membranes are moist.  Eyes:     Conjunctiva/sclera: Conjunctivae normal.  Cardiovascular:     Rate and Rhythm: Normal rate and regular rhythm.     Pulses: Normal pulses.     Heart sounds: Normal heart sounds.  Pulmonary:     Effort: Pulmonary effort is normal.     Breath sounds: Normal breath sounds.  Abdominal:     General: Bowel sounds are normal.     Palpations: Abdomen is soft.  Musculoskeletal:        General: No swelling. Normal range of motion.      Cervical back: Normal range of motion.  Skin:    General: Skin is warm and dry.  Neurological:     General: No focal deficit present.     Mental Status: He is alert and oriented to person, place, and time.  Psychiatric:        Mood and Affect: Mood normal.        Behavior: Behavior normal.        Thought Content: Thought content normal.        Judgment: Judgment normal.     Labs reviewed: Basic Metabolic Panel: Recent Labs    06/04/24 0957  NA 140  K 4.2  CL 104  CO2 28  GLUCOSE 88  BUN 15  CREATININE 0.85  CALCIUM  9.4   Liver Function Tests: Recent Labs    06/04/24 0957  AST 16  ALT 18  BILITOT 0.6  PROT 8.0   No results for input(s): LIPASE, AMYLASE in the last 8760 hours. No results for input(s): AMMONIA in the last 8760 hours. CBC: Recent Labs    06/04/24 0957  WBC 6.1  NEUTROABS 3,855  HGB 13.2  HCT 40.0  MCV 94.6  PLT 206   Lipid Panel: Recent Labs    06/04/24 0957  CHOL 113  HDL 54  LDLCALC 46  TRIG 44  CHOLHDL 2.1   Lab Results  Component Value Date   HGBA1C 5.4 05/20/2023    Procedures since last visit: No results found.  Assessment/Plan  1. Bilateral impacted cerumen (Primary) -  ear lavage on bilateral ears done on both ears and obtained moderate amount of cerumen -  Advised against metal devices for cleaning. - Continue ear drops as needed. - Follow-up in February for ear examination.  2. Essential hypertension -  BP stable -  Continue amlodipine  10 mg daily.         Labs/tests ordered:  None   Return if symptoms worsen or fail to improve.  Dene Nazir Medina-Vargas, NP

## 2024-06-25 ENCOUNTER — Ambulatory Visit: Admitting: Adult Health

## 2024-08-14 ENCOUNTER — Ambulatory Visit: Admitting: Adult Health

## 2024-08-15 ENCOUNTER — Encounter: Payer: Self-pay | Admitting: Adult Health

## 2024-08-15 ENCOUNTER — Ambulatory Visit (INDEPENDENT_AMBULATORY_CARE_PROVIDER_SITE_OTHER): Admitting: Adult Health

## 2024-08-15 VITALS — BP 132/84 | Temp 97.5°F | Ht 67.0 in | Wt 185.0 lb

## 2024-08-15 DIAGNOSIS — H9193 Unspecified hearing loss, bilateral: Secondary | ICD-10-CM

## 2024-08-15 DIAGNOSIS — Z0000 Encounter for general adult medical examination without abnormal findings: Secondary | ICD-10-CM | POA: Diagnosis not present

## 2024-08-15 DIAGNOSIS — H548 Legal blindness, as defined in USA: Secondary | ICD-10-CM | POA: Insufficient documentation

## 2024-08-15 NOTE — Patient Instructions (Addendum)
 Visit local pharmacy if you want tetanus or COVID in the future.   Tyler Alvarado , Thank you for taking time to come for your Medicare Wellness Visit. I appreciate your ongoing commitment to your health goals. Please review the following plan we discussed and let me know if I can assist you in the future.   These are the goals we discussed:  Goals      Exercise 3x per week (30 min per time)     - will exercise at home        This is a list of the screening recommended for you and due dates:  Health Maintenance  Topic Date Due   Medicare Annual Wellness Visit  Never done   Flu Shot  10/09/2024*   COVID-19 Vaccine (4 - 2025-26 season) 11/12/2024*   DTaP/Tdap/Td vaccine (1 - Tdap) 08/15/2025*   Pneumococcal Vaccine for age over 68 (1 of 1 - PCV) 08/15/2025*   Hepatitis C Screening  Completed   Meningitis B Vaccine  Aged Out   Zoster (Shingles) Vaccine  Discontinued  *Topic was postponed. The date shown is not the original due date.

## 2024-08-15 NOTE — Progress Notes (Signed)
 "  Chief Complaint  Patient presents with   Annual Exam    Annual wellness visit. Dicussed need for covid booster, td/tdap, (pharmacy) and pneumonia vaccine(declined). Here with daughter, Ovid      Subjective:   Tyler Alvarado is a 79 y.o. male who presents for a Medicare Annual Wellness Visit.  Visit info / Clinical Intake: Medicare Wellness Visit Type:: Subsequent Annual Wellness Visit Persons participating in visit and providing information:: patient & caregiver Medicare Wellness Visit Mode:: In-person (required for WTM) Interpreter Needed?: No Pre-visit prep was completed: no AWV questionnaire completed by patient prior to visit?: no Living arrangements:: (!) lives alone Patient's Overall Health Status Rating: very good Typical amount of pain: some Does pain affect daily life?: no Are you currently prescribed opioids?: no  Dietary Habits and Nutritional Risks How many meals a day?: 2 Eats fruit and vegetables daily?: yes Most meals are obtained by: preparing own meals; eating out; having others provide food In the last 2 weeks, have you had any of the following?: none Diabetic:: no  Fall Screening Falls in the past year?: 0 Number of falls in past year: 0 Was there an injury with Fall?: 0 Fall Risk Category Calculator: 0 Patient Fall Risk Level: Low Fall Risk  Fall Risk Patient at Risk for Falls Due to: No Fall Risks Fall risk Follow up: Falls evaluation completed  Cognitive Assessment Difficulty concentrating, remembering, or making decisions? : yes (remembering) Will 6CIT or Mini Cog be Completed: yes What year is it?: 0 points What month is it?: 0 points Give patient an address phrase to remember (5 components): [address], [place]  About what time is it?: 0 points Count backwards from 20 to 1: 0 points Say the months of the year in reverse: 0 points Repeat the address phrase from earlier: 0 points 6 CIT Score: 0 points  Advance Directives  (For Healthcare) Does Patient Have a Medical Advance Directive?: No Would patient like information on creating a medical advance directive?: Yes (MAU/Ambulatory/Procedural Areas - Information given)    Allergies (verified) Porcine (pork) protein-containing drug products   Current Medications (verified) Outpatient Encounter Medications as of 08/15/2024  Medication Sig   amLODipine  (NORVASC ) 10 MG tablet Take 1 tablet (10 mg total) by mouth daily.   aspirin  EC 81 MG EC tablet Take 1 tablet (81 mg total) by mouth daily. Swallow whole.   atorvastatin  (LIPITOR ) 40 MG tablet Take 1 tablet (40 mg total) by mouth daily.   Multiple Vitamins-Minerals (MULTIVITAMIN WITH MINERALS) tablet Take 1 tablet by mouth daily.   No facility-administered encounter medications on file as of 08/15/2024.    History: Past Medical History:  Diagnosis Date   Hypertension    Stroke [facility])    Past Surgical History:  Procedure Laterality Date   EYE SURGERY     History reviewed. No pertinent family history. Social History   Occupational History   Not on file  Tobacco Use   Smoking status: Never   Smokeless tobacco: Not on file  Vaping Use   Vaping status: Never Used  Substance and Sexual Activity   Alcohol use: Yes    Comment: occasional beer   Drug use: No   Sexual activity: Never   Tobacco Counseling Counseling given: Not Answered  SDOH Screenings   Food Insecurity: No Food Insecurity (02/09/2023)  Housing: Low Risk (02/09/2023)  Transportation Needs: No Transportation Needs (02/09/2023)  Utilities: Not At Risk (02/09/2023)  Depression (PHQ2-9): Low Risk (08/15/2024)  Social Connections: Unknown (  02/08/2023)   Received from [facility]  Tobacco Use: Unknown (08/15/2024)   See flowsheets for full screening details  Depression Screen PHQ 2 & 9 Depression Scale- Over the past 2 weeks, how often have you been bothered by any of the following problems? Little interest or pleasure in doing things:  0 Feeling down, depressed, or hopeless (PHQ Adolescent also includes...irritable): 0 PHQ-2 Total Score: 0     Goals Addressed   None          Objective:    Today's Vitals   08/15/24 1306  BP: 132/84  Temp: (!) 97.5 F (36.4 C)  SpO2: 96%  Weight: 185 lb (83.9 kg)  Height: 5' 7 (1.702 m)   Body mass index is 28.98 kg/m.  Hearing/Vision screen Hearing Screening - Comments:: Pt with hearing difficulty, would like referral to audiologist Vision Screening - Comments:: Per patient no eye exam (&quot;that would not do me any good&quot;) Immunizations and Health Maintenance Health Maintenance  Topic Date Due   Medicare Annual Wellness (AWV)  Never done   Influenza Vaccine  10/09/2024 (Originally 02/10/2024)   COVID-19 Vaccine (4 - 2025-26 season) 11/12/2024 (Originally 03/12/2024)   DTaP/Tdap/Td (1 - Tdap) 08/15/2025 (Originally 08/17/1964)   Pneumococcal Vaccine: 50+ Years (1 of 1 - PCV) 08/15/2025 (Originally 08/17/1996)   Hepatitis C Screening  Completed   Meningococcal B Vaccine  Aged Out   Zoster Vaccines- Shingrix  Discontinued        Assessment/Plan:  This is a routine wellness examination for Tyler Alvarado.  Patient Care Team: Alvarado, Natayah Warmack C, NP as PCP - General (Internal Medicine)  I have personally reviewed and noted the following in the patients chart:   Medical and social history Use of alcohol, tobacco or illicit drugs  Current medications and supplements including opioid prescriptions. Functional ability and status Nutritional status Physical activity Advanced directives List of other physicians Hospitalizations, surgeries, and ER visits in previous 12 months Vitals Screenings to include cognitive, depression, and falls Referrals and appointments  No orders of the defined types were placed in this encounter.  In addition, I have reviewed and discussed with patient certain preventive protocols, quality metrics, and best practice recommendations. A  written personalized care plan for preventive services as well as general preventive health recommendations were provided to patient.   Tyler Brar Medina-Vargas, NP   08/15/2024   No follow-ups on file.  After Visit Summary: (In Person-Printed) AVS printed and given to the patient  Nurse Notes: Needs to to have medicare annual wellness visit. "

## 2024-09-03 ENCOUNTER — Ambulatory Visit: Admitting: Audiology

## 2025-01-24 ENCOUNTER — Ambulatory Visit: Admitting: Adult Health
